# Patient Record
Sex: Male | Born: 1980 | Race: White | Hispanic: No | Marital: Married | Smoking: Former smoker
Health system: Southern US, Community
[De-identification: ages and names within clinical notes are randomized; demographics above are authoritative.]

---
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
## 2009-07-21 ENCOUNTER — Ambulatory Visit: Payer: Self-pay | Admitting: Internal Medicine

## 2009-07-21 DIAGNOSIS — M545 Low back pain: Secondary | ICD-10-CM

## 2009-07-21 DIAGNOSIS — R519 Headache, unspecified: Secondary | ICD-10-CM | POA: Insufficient documentation

## 2009-07-21 DIAGNOSIS — R51 Headache: Secondary | ICD-10-CM

## 2009-07-21 LAB — CONVERTED CEMR LAB
AST: 29 units/L (ref 0–37)
Alkaline Phosphatase: 56 units/L (ref 39–117)
BUN: 6 mg/dL (ref 6–23)
Basophils Absolute: 0 10*3/uL (ref 0.0–0.1)
Bilirubin, Direct: 0.1 mg/dL (ref 0.0–0.3)
Calcium: 9.7 mg/dL (ref 8.4–10.5)
GFR calc non Af Amer: 93.68 mL/min (ref >60)
Glucose, Bld: 85 mg/dL (ref 70–99)
Hemoglobin: 17.1 g/dL — ABNORMAL HIGH (ref 13.0–17.0)
Lymphocytes Relative: 27.9 % (ref 12.0–46.0)
Monocytes Relative: 6.6 % (ref 3.0–12.0)
Neutrophils Relative %: 63.9 % (ref 43.0–77.0)
Platelets: 317 10*3/uL (ref 150.0–400.0)
RDW: 12 % (ref 11.5–14.6)
Sodium: 139 meq/L (ref 135–145)
Total Bilirubin: 1.1 mg/dL (ref 0.3–1.2)

## 2009-11-10 ENCOUNTER — Telehealth: Payer: Self-pay | Admitting: Internal Medicine

## 2010-09-14 NOTE — Progress Notes (Signed)
Summary: requesting labs  Phone Note Call from Patient Call back at Home Phone [phone]   Caller: Patient---live call Summary of Call: Pt is requesting STD labs. which ones to order? Please list them. Thanks. Initial call taken by: Warnell Forester,  November 10, 2009 9:40 AM                                               without high risk exposure history and in the absense of signs or symptoms, HIV would be only recommendation

## 2018-03-11 ENCOUNTER — Encounter: Payer: Self-pay | Admitting: Family Medicine

## 2018-03-11 ENCOUNTER — Ambulatory Visit: Payer: 59 | Admitting: Family Medicine

## 2018-03-11 VITALS — BP 138/84 | HR 78 | Temp 98.9°F | Wt 200.2 lb

## 2018-03-11 DIAGNOSIS — M7041 Prepatellar bursitis, right knee: Secondary | ICD-10-CM

## 2018-03-11 DIAGNOSIS — R748 Abnormal levels of other serum enzymes: Secondary | ICD-10-CM | POA: Diagnosis not present

## 2018-03-11 DIAGNOSIS — Z13220 Encounter for screening for lipoid disorders: Secondary | ICD-10-CM | POA: Diagnosis not present

## 2018-03-11 DIAGNOSIS — Z114 Encounter for screening for human immunodeficiency virus [HIV]: Secondary | ICD-10-CM | POA: Diagnosis not present

## 2018-03-11 NOTE — Progress Notes (Signed)
Subjective:  Roy Cervantes is a 37 y.o. male who presents today with a chief complaint of knee swelling and to establish care.   HPI:  Knee Swelling, chronic problem Symptoms started couple weeks ago.  Located to his right knee.  No obvious precipitating events.  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  Symptoms have been stable over the last couple of weeks.  No specific treatments tried.  No history of trauma to the area.  No significant pain.  No limited range of motion.  Elevated Liver Enzymes, new problem to provider Patient found to have elevated liver test on recent screening blood work.  He is not sure of which tests were elevated.  He is concerned because he has a family history of liver disease.  ROS: Per HPI, otherwise a complete review of systems was negative.   PMH:  The following were reviewed and entered/updated in epic: History reviewed. No pertinent past medical history. Patient Active Problem List   Diagnosis Date Noted  . LOW BACK PAIN 07/21/2009  . HEADACHE 07/21/2009   History reviewed. No pertinent surgical history.  Family History  Problem Relation Age of Onset  . Liver disease Mother   . Mesothelioma Maternal Grandfather   . Heart disease Neg Hx     Medications- reviewed and updated No current outpatient medications on file.   No current facility-administered medications for this visit.     Allergies-reviewed and updated No Known Allergies  Social History   Socioeconomic History  . Marital status: Married    Spouse name: Not on file  . Number of children: Not on file  . Years of education: Not on file  . Highest education level: Not on file  Occupational History  . Not on file  Social Needs  . Financial resource strain: Not on file  . Food insecurity:    Worry: Not on file    Inability: Not on file  . Transportation needs:    Medical: Not on file    Non-medical: Not on file  Tobacco Use  . Smoking  status: Former Games developermoker  . Smokeless tobacco: Never Used  . Tobacco comment: 4 years about a pack per day  Substance and Sexual Activity  . Alcohol use: Yes  . Drug use: Never  . Sexual activity: Yes  Lifestyle  . Physical activity:    Days per week: Not on file    Minutes per session: Not on file  . Stress: Not on file  Relationships  . Social connections:    Talks on phone: Not on file    Gets together: Not on file    Attends religious service: Not on file    Active member of club or organization: Not on file    Attends meetings of clubs or organizations: Not on file    Relationship status: Not on file  Other Topics Concern  . Not on file  Social History Narrative  . Not on file    Objective:  Physical Exam: BP 138/84 (BP Location: Left Arm, Patient Position: Sitting, Cuff Size: Normal)   Pulse 78   Temp 98.9 F (37.2 C) (Oral)   Wt 200 lb 3.2 oz (90.8 kg)   SpO2 98%   BMI 27.15 kg/m   Gen: NAD, resting comfortably CV: RRR with no murmurs appreciated Pulm: NWOB, CTAB with no crackles, wheezes, or rhonchi GI: Normal bowel sounds present. Soft, Nontender, Nondistended. MSK: -Right knee: Approximately 5  cm cystic lesion on anterior aspect of right knee.  No other deformities noted.  Strength 5 out of 5 in all directions.  Anterior and posterior drawer signs negative. Skin: Warm, dry Neuro: Grossly normal, moves all extremities Psych: Normal affect and thought content  Assessment/Plan:  Prepatellar bursitis No red flag signs or symptoms.  No signs of infection.  Knee swelling likely secondary to prepatellar bursitis.  Recommended use of compression, ice, elevation, and over the counter anti-inflammatories as needed.  Discussed reasons to return to care.  Elevated liver enzymes Check CMET with next blood draw.  Preventative Healthcare Patient was instructed to return soon for CPE.  We will check blood work with lipid panel and HIV antibody at that time. Health  Maintenance Due  Topic Date Due  . HIV Screening  03/26/1995   Katina Degree. [name], MD 03/11/2018 4:15 PM

## 2018-03-11 NOTE — Patient Instructions (Addendum)
It was very nice to see you today!  You have prepatellar bursitis.  This is a benign swelling and 1 of the lubricating sacs in your knee.  This does not cause any issue or long-term harm.  Please use compression, over-the-counter anti-inflammatories, and ice as needed.  Please let me know if you have any worsening pain, redness, swelling, or any other change in symptoms.  Please come back soon for blood work and your physical.   Take care, Dr [name]   Prepatellar Bursitis Prepatellar bursitis is inflammation of the prepatellar bursa. The prepatellar bursa is a fluid-filled sac that cushions the kneecap (patella). Prepatellar bursitis happens when fluid builds up in the this sac and causes it to get larger. The condition causes knee pain. What are the causes? This condition may be caused by:  Constant pressure on the knees from kneeling.  A hit to the knee.  Falling on the knee.  A bacterial infection.  Moving the knee often in a forceful way.  What increases the risk? This condition is more likely to develop in:  People who play a sport that involves a risk for falls on the knee or hard hits (blows) to the knee. These sports include: ? Football. ? Wrestling. ? Basketball. ? Soccer.  People who have to kneel for long periods of time, such as roofers, plumbers, and gardeners.  People with another inflammatory condition, such as gout or rheumatoid arthritis.  What are the signs or symptoms? The most common symptom of this condition is knee pain that gets better with rest. Other symptoms include:  Swelling on the front of the kneecap.  Warmth in the knee.  Tenderness with activity.  Redness in the knee.  Inability to bend the knee or to kneel.  How is this diagnosed? This condition may be diagnosed based on:  Your symptoms.  Your medical history.  A physical exam. During the exam, your provider will compare your knees and check for tenderness and pain when moving  your knee. Your health care provider may also use a needle to remove fluid from the bursa to help diagnose an infection.  Tests, such as: ? A blood test that checks for infection. ? X-rays. These may be taken to check the structure of the patella. ? MRI or ultrasound. These may be done to check for swelling and fluid buildup in the bursa.  How is this treated? This condition may be treated by:  Resting the knee.  Applying ice to the knee.  Medicine, such as: ? Nonsteroidal anti-inflammatory drugs (NSAIDs). These can help to reduce pain and swelling. ? Antibiotic medicines. These may be needed if you have an infection. ? Steroid medicines. These may be prescribed if other treatments are not helping.  Raising (elevating) the knee while resting.  Doing strengthening and stretching exercises (physical therapy). These may be recommended after pain and swelling improve.  Having a procedure to remove fluid from the bursa. This may be done if other treatment is not helping.  Having surgery to remove the bursa. This may be done if you have a severe infection or if the condition keeps coming back after treatment.  Follow these instructions at home: Medicines  Take over-the-counter and prescription medicines only as told by your health care provider.  If you were prescribed an antibiotic medicine, take it as told by your health care provider. Do not stop taking the antibiotic even if you start to feel better. Managing pain, stiffness, and swelling  If  directed, apply ice to your knee. ? Put ice in a plastic bag. ? Place a towel between your skin and the bag. ? Leave the ice on for 20 minutes, 2-3 times a day.  Elevate your knee above the level of your heart while you are sitting or lying down. Driving  Do not drive or operate heavy machinery while taking prescription pain medicine.  Ask your health care provider when it is safe fpr you to drive. Activity  Rest your knee.  Avoid  activities that cause pain.  Return to your normal activities as told by your health care provider. Ask your health care provider what activities are safe for you.  Do exercises as told by your health care provider. General instructions  Do not use the injured limb to support your body weight until your health care provider says that you can.  Do not use any tobacco products, such as cigarettes, chewing tobacco, and e-cigarettes. Tobacco can delay bone healing. If you need help quitting, ask your health care provider.  Keep all follow-up visits as told by your health care provider. This is important. How is this prevented?  Warm up and stretch before being active.  Cool down and stretch after being active.  Give your body time to rest between periods of activity.  Make sure to use equipment that fits you.  Be safe and responsible while being active to avoid falls.  Do at least 150 minutes of moderate-intensity exercise each week, such as brisk walking or water aerobics.  Maintain physical fitness, including: ? Strength. ? Flexibility. ? Cardiovascular fitness. ? Endurance. Contact a health care provider if:  Your symptoms do not improve.  Your symptoms get worse.  Your symptoms keep coming back after treatment.  You develop a fever and have warmth, redness, and swelling over your knee. This information is not intended to replace advice given to you by your health care provider. Make sure you discuss any questions you have with your health care provider. Document Released: 07/30/2005 Document Revised: 04/03/2016 Document Reviewed: 04/29/2015 Elsevier Interactive Patient Education  [employer]2018 Else[employer] Inc[employer]

## 2018-08-21 ENCOUNTER — Ambulatory Visit (INDEPENDENT_AMBULATORY_CARE_PROVIDER_SITE_OTHER): Payer: 59

## 2018-08-21 ENCOUNTER — Encounter: Payer: Self-pay | Admitting: Family Medicine

## 2018-08-21 ENCOUNTER — Ambulatory Visit: Payer: 59 | Admitting: Family Medicine

## 2018-08-21 VITALS — BP 112/76 | HR 59 | Temp 97.9°F | Ht 72.0 in | Wt 206.2 lb

## 2018-08-21 DIAGNOSIS — M545 Low back pain, unspecified: Secondary | ICD-10-CM

## 2018-08-21 DIAGNOSIS — M47815 Spondylosis without myelopathy or radiculopathy, thoracolumbar region: Secondary | ICD-10-CM | POA: Diagnosis not present

## 2018-08-21 MED ORDER — CYCLOBENZAPRINE HCL 10 MG PO TABS: 10.0000 mg | ORAL_TABLET | Freq: Three times a day (TID) | ORAL | 0 refills | Status: DC | PRN

## 2018-08-21 MED ORDER — DICLOFENAC SODIUM 75 MG PO TBEC: 75.0000 mg | DELAYED_RELEASE_TABLET | Freq: Two times a day (BID) | ORAL | 0 refills | Status: DC

## 2018-08-21 NOTE — Progress Notes (Signed)
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 Recommend continuing meds and exercise as we discussed. Can refer to Dr Berline Chough if not improving.  Roy Cervantes. [name], MD 08/21/2018 1:01 PM

## 2018-08-21 NOTE — Patient Instructions (Signed)
It was very nice to see you today!  I think you have tight muscles and connective tissue in your lower back that is causing your symptoms.  Please start the anti-inflammatory. Use the  muscle relaxer as needed.  Please work on exercises.  We will see you back in a couple weeks for your physical.  We may need to get you in to see sports medicine if your symptoms are not improving.  Take care, Dr [name]

## 2018-08-21 NOTE — Progress Notes (Signed)
   Subjective:  Roy Cervantes is a 38 y.o. male who presents today for same-day appointment with a chief complaint of low back pain.   HPI:  Low Back Pain, Acute problem He has had intermittent back pain on and off for several years.  He has never been seen here for this problem.  Starting about 5 days ago he had significant worsening of his symptoms.  Symptoms occurred randomly while standing.  Since then he has had persistent, severe lower back pain.  The first day he had some radiation into his hips and knees bilaterally, however this is since subsided.  He took a few doses of gabapentin which seemed to have helped.  Has seen a chiropractor in the past which has helped. No other treatments tried.  No weakness or numbness.  No bowel or bladder incontinence.  No urinary retention.  No other obvious alleviating or aggravating factors.   ROS: Per HPI  PMH: He reports that he has quit smoking. He has never used smokeless tobacco. He reports current alcohol use. He reports that he does not use drugs.  Objective:  Physical Exam: BP 112/76 (BP Location: Left Arm, Patient Position: Sitting, Cuff Size: Large)   Pulse (!) 59   Temp 97.9 F (36.6 C) (Oral)   Ht 6' (1.829 m)   Wt 206 lb 4 oz (93.6 kg)   SpO2 96%   BMI 27.97 kg/m   Wt Readings from Last 3 Encounters:  08/21/18 206 lb 4 oz (93.6 kg)  03/11/18 200 lb 3.2 oz (90.8 kg)  Gen: NAD, resting comfortably MSK: -Back: No deformities.  No midline tenderness.  Some tenderness to palpation along lower lumbar paraspinal muscles with some connective tissue tightness noted. -Lower extremities: No deformities.  Strength 5 out of 5 throughout.  Neurovascular intact distally.  Straight leg raise test negative bilaterally.  Assessment/Plan:  Low Back Pain No red flags. Likely muscular.  Given that he has had intermittent symptoms for the past several years and no recent imaging, we will check plain film today.  Start diclofenac 75 mg twice daily  x1 to 2 weeks.  Start Flexeril 10 mg 3 times daily as needed.  Discussed home exercise program and handout was given.  Discussed reasons to return to care.  May need sports medicine referral if symptoms do not improve with above.  Katina Degreealeb M. Jimmey RalphParker, MD 08/21/2018 9:53 AM

## 2018-08-29 ENCOUNTER — Encounter: Payer: Self-pay | Admitting: Family Medicine

## 2018-08-29 ENCOUNTER — Ambulatory Visit (INDEPENDENT_AMBULATORY_CARE_PROVIDER_SITE_OTHER): Payer: 59 | Admitting: Family Medicine

## 2018-08-29 VITALS — BP 118/78 | HR 65 | Temp 98.7°F | Ht 72.0 in | Wt 200.2 lb

## 2018-08-29 DIAGNOSIS — R748 Abnormal levels of other serum enzymes: Secondary | ICD-10-CM | POA: Diagnosis not present

## 2018-08-29 DIAGNOSIS — Z114 Encounter for screening for human immunodeficiency virus [HIV]: Secondary | ICD-10-CM | POA: Diagnosis not present

## 2018-08-29 DIAGNOSIS — Z6827 Body mass index (BMI) 27.0-27.9, adult: Secondary | ICD-10-CM

## 2018-08-29 DIAGNOSIS — Z0001 Encounter for general adult medical examination with abnormal findings: Secondary | ICD-10-CM | POA: Diagnosis not present

## 2018-08-29 DIAGNOSIS — Z13220 Encounter for screening for lipoid disorders: Secondary | ICD-10-CM | POA: Diagnosis not present

## 2018-08-29 LAB — CBC
HCT: 45.2 % (ref 39.0–52.0)
Hemoglobin: 15.9 g/dL (ref 13.0–17.0)
MCHC: 35.2 g/dL (ref 30.0–36.0)
MCV: 88.8 fl (ref 78.0–100.0)
Platelets: 319 10*3/uL (ref 150.0–400.0)
RBC: 5.09 Mil/uL (ref 4.22–5.81)
RDW: 12.6 % (ref 11.5–15.5)
WBC: 5.7 10*3/uL (ref 4.0–10.5)

## 2018-08-29 LAB — LIPID PANEL
Cholesterol: 174 mg/dL (ref 0–200)
HDL: 39.3 mg/dL (ref >39.00)
LDL Cholesterol: 104 mg/dL — ABNORMAL HIGH (ref 0–99)
NonHDL: 134.23
Total CHOL/HDL Ratio: 4
Triglycerides: 149 mg/dL (ref 0.0–149.0)
VLDL: 29.8 mg/dL (ref 0.0–40.0)

## 2018-08-29 LAB — COMPREHENSIVE METABOLIC PANEL
ALT: 89 U/L — AB (ref 0–53)
AST: 38 U/L — AB (ref 0–37)
Albumin: 4.6 g/dL (ref 3.5–5.2)
Alkaline Phosphatase: 65 U/L (ref 39–117)
BILIRUBIN TOTAL: 0.9 mg/dL (ref 0.2–1.2)
BUN: 14 mg/dL (ref 6–23)
CHLORIDE: 105 meq/L (ref 96–112)
CO2: 25 meq/L (ref 19–32)
CREATININE: 1.02 mg/dL (ref 0.40–1.50)
Calcium: 9.9 mg/dL (ref 8.4–10.5)
GFR: 81.55 mL/min (ref >60.00)
Glucose, Bld: 91 mg/dL (ref 70–99)
Potassium: 4.3 mEq/L (ref 3.5–5.1)
Sodium: 138 mEq/L (ref 135–145)
Total Protein: 7.3 g/dL (ref 6.0–8.3)

## 2018-08-29 LAB — TSH: TSH: 3.4 u[IU]/mL (ref 0.35–4.50)

## 2018-08-29 NOTE — Progress Notes (Signed)
Subjective:  Roy Cervantes is a 38 y.o. male who presents today for his annual comprehensive physical exam.    HPI:  He has no acute complaints today.   Lifestyle Diet: Eats a lot of fast food. Trying to cut out junk foods.  Exercise: No regular exercise.   Depression screen PHQ 2/9 03/11/2018  Decreased Interest 0  Down, Depressed, Hopeless 0  PHQ - 2 Score 0   Health Maintenance Due  Topic Date Due  . HIV Screening  03/26/1995    ROS: Some back pain that is improving, otherwise a complete review of systems was negative.   PMH:  The following were reviewed and entered/updated in epic: History reviewed. No pertinent past medical history. There are no active problems to display for this patient.  History reviewed. No pertinent surgical history.  Family History  Problem Relation Age of Onset  . Liver disease Mother   . Mesothelioma Maternal Grandfather   . Heart disease Neg Hx     Medications- reviewed and updated No current outpatient medications on file.   No current facility-administered medications for this visit.     Allergies-reviewed and updated No Known Allergies  Social History   Socioeconomic History  . Marital status: Married    Spouse name: Not on file  . Number of children: Not on file  . Years of education: Not on file  . Highest education level: Not on file  Occupational History  . Not on file  Social Needs  . Financial resource strain: Not on file  . Food insecurity:    Worry: Not on file    Inability: Not on file  . Transportation needs:    Medical: Not on file    Non-medical: Not on file  Tobacco Use  . Smoking status: Former Games developermoker  . Smokeless tobacco: Never Used  . Tobacco comment: 4 years about a pack per day  Substance and Sexual Activity  . Alcohol use: Yes  . Drug use: Never  . Sexual activity: Yes  Lifestyle  . Physical activity:    Days per week: Not on file    Minutes per session: Not on file  . Stress: Not on  file  Relationships  . Social connections:    Talks on phone: Not on file    Gets together: Not on file    Attends religious service: Not on file    Active member of club or organization: Not on file    Attends meetings of clubs or organizations: Not on file    Relationship status: Not on file  Other Topics Concern  . Not on file  Social History Narrative  . Not on file    Objective:  Physical Exam: BP 118/78 (BP Location: Left Arm, Patient Position: Sitting, Cuff Size: Normal)   Pulse 65   Temp 98.7 F (37.1 C) (Oral)   Ht 6' (1.829 m)   Wt 200 lb 4 oz (90.8 kg)   SpO2 99%   BMI 27.16 kg/m   Body mass index is 27.16 kg/m. Wt Readings from Last 3 Encounters:  08/29/18 200 lb 4 oz (90.8 kg)  08/21/18 206 lb 4 oz (93.6 kg)  03/11/18 200 lb 3.2 oz (90.8 kg)   Gen: NAD, resting comfortably HEENT: TMs normal bilaterally. OP clear. No thyromegaly noted.  CV: RRR with no murmurs appreciated Pulm: NWOB, CTAB with no crackles, wheezes, or rhonchi GI: Normal bowel sounds present. Soft, Nontender, Nondistended. MSK: no edema, cyanosis, or clubbing noted Skin:  warm, dry Neuro: CN2-12 grossly intact. Strength 5/5 in upper and lower extremities. Reflexes symmetric and intact bilaterally.  Psych: Normal affect and thought content  Assessment/Plan:  Elevated LFT Check CBC, CMET, TSH  BMI 27 Discussed lifestyle modifications.  Preventative Healthcare: Check lipid panel and HIV antibody.  Patient Counseling(The following topics were reviewed and/or handout was given):  -Nutrition: Stressed importance of moderation in sodium/caffeine intake, saturated fat and cholesterol, caloric balance, sufficient intake of fresh fruits, vegetables, and fiber.  -Stressed the importance of regular exercise.   -Substance Abuse: Discussed cessation/primary prevention of tobacco, alcohol, or other drug use; driving or other dangerous activities under the influence; availability of treatment for  abuse.   -Injury prevention: Discussed safety belts, safety helmets, smoke detector, smoking near bedding or upholstery.   -Sexuality: Discussed sexually transmitted diseases, partner selection, use of condoms, avoidance of unintended pregnancy and contraceptive alternatives.   -Dental health: Discussed importance of regular tooth brushing, flossing, and dental visits.  -Health maintenance and immunizations reviewed. Please refer to Health maintenance section.  Return to care in 1 year for next preventative visit.   Katina Degreealeb M. Jimmey RalphParker, MD 08/29/2018 8:40 AM

## 2018-08-29 NOTE — Patient Instructions (Signed)
It was very nice to see you today!  Keep up the good work!  We will check blood work today.  Come back to see me in 1 year for your next physical, or sooner as needed.   Take care, Dr Jerline Pain   Preventive Care 18-39 Years, Male Preventive care refers to lifestyle choices and visits with your health care provider that can promote health and wellness. What does preventive care include?   A yearly physical exam. This is also called an annual well check.  Dental exams once or twice a year.  Routine eye exams. Ask your health care provider how often you should have your eyes checked.  Personal lifestyle choices, including: ? Daily care of your teeth and gums. ? Regular physical activity. ? Eating a healthy diet. ? Avoiding tobacco and drug use. ? Limiting alcohol use. ? Practicing safe sex. What happens during an annual well check? The services and screenings done by your health care provider during your annual well check will depend on your age, overall health, lifestyle risk factors, and family history of disease. Counseling Your health care provider may ask you questions about your:  Alcohol use.  Tobacco use.  Drug use.  Emotional well-being.  Home and relationship well-being.  Sexual activity.  Eating habits.  Work and work Statistician. Screening You may have the following tests or measurements:  Height, weight, and BMI.  Blood pressure.  Lipid and cholesterol levels. These may be checked every 5 years starting at age 31.  Diabetes screening. This is done by checking your blood sugar (glucose) after you have not eaten for a while (fasting).  Skin check.  Hepatitis C blood test.  Hepatitis B blood test.  Sexually transmitted disease (STD) testing. Discuss your test results, treatment options, and if necessary, the need for more tests with your health care provider. Vaccines Your health care provider may recommend certain vaccines, such  as:  Influenza vaccine. This is recommended every year.  Tetanus, diphtheria, and acellular pertussis (Tdap, Td) vaccine. You may need a Td booster every 10 years.  Varicella vaccine. You may need this if you have not been vaccinated.  HPV vaccine. If you are 76 or younger, you may need three doses over 6 months.  Measles, mumps, and rubella (MMR) vaccine. You may need at least one dose of MMR.You may also need a second dose.  Pneumococcal 13-valent conjugate (PCV13) vaccine. You may need this if you have certain conditions and have not been vaccinated.  Pneumococcal polysaccharide (PPSV23) vaccine. You may need one or two doses if you smoke cigarettes or if you have certain conditions.  Meningococcal vaccine. One dose is recommended if you are age 49-21 years and a first-year college student living in a residence hall, or if you have one of several medical conditions. You may also need additional booster doses.  Hepatitis A vaccine. You may need this if you have certain conditions or if you travel or work in places where you may be exposed to hepatitis A.  Hepatitis B vaccine. You may need this if you have certain conditions or if you travel or work in places where you may be exposed to hepatitis B.  Haemophilus influenzae type b (Hib) vaccine. You may need this if you have certain risk factors. Talk to your health care provider about which screenings and vaccines you need and how often you need them. This information is not intended to replace advice given to you by your health care provider. Make  sure you discuss any questions you have with your health care provider. Document Released: 09/25/2001 Document Revised: 03/12/2017 Document Reviewed: 05/31/2015 Elsevier Interactive Patient Education  2019 [employer].

## 2018-08-30 LAB — HIV ANTIBODY (ROUTINE TESTING W REFLEX): HIV: NONREACTIVE

## 2018-09-01 ENCOUNTER — Encounter: Payer: Self-pay | Admitting: Family Medicine

## 2018-09-01 DIAGNOSIS — R945 Abnormal results of liver function studies: Secondary | ICD-10-CM

## 2018-09-01 DIAGNOSIS — R7989 Other specified abnormal findings of blood chemistry: Secondary | ICD-10-CM | POA: Insufficient documentation

## 2018-09-01 DIAGNOSIS — E785 Hyperlipidemia, unspecified: Secondary | ICD-10-CM | POA: Insufficient documentation

## 2018-09-01 NOTE — Progress Notes (Signed)
Please inform patient of the following:  His liver tests were up just a bit. Would like for him to come back in a week or two to recheck. Please place future order for CMET.  Otherwise, his cholesterol was up just a bit but everything else looks good. Do not need to start meds. Recommend continuing to work on diet and exercise and we can recheck everything else in a year or so.  Katina Degree. [name], MD 09/01/2018 5:36 PM

## 2018-09-02 ENCOUNTER — Other Ambulatory Visit: Payer: Self-pay | Admitting: Family Medicine

## 2018-09-02 DIAGNOSIS — R748 Abnormal levels of other serum enzymes: Secondary | ICD-10-CM

## 2018-09-12 ENCOUNTER — Other Ambulatory Visit (INDEPENDENT_AMBULATORY_CARE_PROVIDER_SITE_OTHER): Payer: 59

## 2018-09-12 DIAGNOSIS — R748 Abnormal levels of other serum enzymes: Secondary | ICD-10-CM

## 2018-09-12 LAB — COMPREHENSIVE METABOLIC PANEL
ALT: 92 U/L — AB (ref 0–53)
AST: 46 U/L — ABNORMAL HIGH (ref 0–37)
Albumin: 4.6 g/dL (ref 3.5–5.2)
Alkaline Phosphatase: 66 U/L (ref 39–117)
BILIRUBIN TOTAL: 0.7 mg/dL (ref 0.2–1.2)
BUN: 13 mg/dL (ref 6–23)
CO2: 26 mEq/L (ref 19–32)
Calcium: 9.8 mg/dL (ref 8.4–10.5)
Chloride: 105 mEq/L (ref 96–112)
Creatinine, Ser: 0.99 mg/dL (ref 0.40–1.50)
GFR: 84.39 mL/min (ref >60.00)
Glucose, Bld: 88 mg/dL (ref 70–99)
Potassium: 4.2 mEq/L (ref 3.5–5.1)
Sodium: 137 mEq/L (ref 135–145)
Total Protein: 7.3 g/dL (ref 6.0–8.3)

## 2018-09-13 NOTE — Progress Notes (Signed)
Please inform patient of the following:  Liver tests are still elevated. Recommend checking RUQ ultrasound to take a look at his liver. Please place future order.   Katina Degree. [name], MD 09/13/2018 11:24 AM

## 2018-09-15 ENCOUNTER — Other Ambulatory Visit: Payer: Self-pay | Admitting: Family Medicine

## 2018-09-15 ENCOUNTER — Telehealth: Payer: Self-pay | Admitting: Family Medicine

## 2018-09-15 DIAGNOSIS — R748 Abnormal levels of other serum enzymes: Secondary | ICD-10-CM

## 2018-09-15 NOTE — Telephone Encounter (Signed)
Related  Lab results to Patient states he already received results.  Ultrsound appointment already  Scheduled.

## 2018-09-15 NOTE — Telephone Encounter (Signed)
Copied from CRM [id number]. Topic: Quick Communication - Lab Results (Clinic Use ONLY) >> Sep 15, 2018  8:27 AM Erick Alley, CMA wrote: Called patient to inform them of  lab results. When patient returns call, triage nurse may disclose results. >> Sep 15, 2018 10:49 AM Arlyss Gandy, NT wrote: Pt returning call to go over lab results.

## 2018-09-15 NOTE — Telephone Encounter (Signed)
See 2nd telephone encounter from 09/15/18

## 2018-09-16 ENCOUNTER — Other Ambulatory Visit: Payer: 59

## 2018-09-19 ENCOUNTER — Ambulatory Visit
Admission: RE | Admit: 2018-09-19 | Discharge: 2018-09-19 | Disposition: A | Discharge status: Home / Self Care | Payer: 59 | Source: Ambulatory Visit | Attending: Family Medicine | Admitting: Family Medicine

## 2018-09-19 DIAGNOSIS — R748 Abnormal levels of other serum enzymes: Secondary | ICD-10-CM

## 2018-09-19 DIAGNOSIS — R7989 Other specified abnormal findings of blood chemistry: Secondary | ICD-10-CM | POA: Diagnosis not present

## 2018-09-24 NOTE — Progress Notes (Signed)
Please inform patient of the following:  His liver ultrasound was NORMAL. Do not need to do any further testing at this point. Would like to recheck labs again in 3-6 months to make sure they are stable. Please place future order for LFTs.   Katina Degree. [name], MD 09/24/2018 10:54 AM

## 2018-09-25 ENCOUNTER — Other Ambulatory Visit: Payer: Self-pay | Admitting: Family Medicine

## 2018-09-25 DIAGNOSIS — R748 Abnormal levels of other serum enzymes: Secondary | ICD-10-CM

## 2019-06-26 ENCOUNTER — Other Ambulatory Visit: Payer: Self-pay

## 2019-06-26 ENCOUNTER — Telehealth (INDEPENDENT_AMBULATORY_CARE_PROVIDER_SITE_OTHER): Payer: 59 | Admitting: Family Medicine

## 2019-06-26 DIAGNOSIS — J3089 Other allergic rhinitis: Secondary | ICD-10-CM

## 2019-06-26 MED ORDER — FLUTICASONE PROPIONATE 50 MCG/ACT NA SUSP: 1.0000 | Freq: Every day | NASAL | 1 refills | Status: DC

## 2019-06-26 NOTE — Progress Notes (Signed)
Virtual Visit via Video Note  I connected with Roy Cervantes on 06/26/19 at  3:00 PM EST by a video enabled telemedicine application 2/2 [id number] pandemic and verified that I am speaking with the correct person using two identifiers.  Location patient: home Location provider:work or home office Persons participating in the virtual visit: patient, provider  I discussed the limitations of evaluation and management by telemedicine and the availability of in person appointments. The patient expressed understanding and agreed to proceed.   HPI: Pt is a 38 yo male with pmh sig for HLD followed by [name], MD    Pt started feeling bad on Monday.  Recently cleaned out a shed that was dusty.  Having pressure in ears, nasal drainage, "congestion in head", and rhinorrhea. Had a mild HA yesterday with pain in L occipital area, since resolved.  Denies fever, sore throat, facial pain/pressure, loss of taste or smell.  Took OTC sinus/cold med.     ROS: See pertinent positives and negatives per HPI.  No past medical history on file.  No past surgical history on file.  Family History  Problem Relation Age of Onset  . Liver disease Mother   . Mesothelioma Maternal Grandfather   . 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 No current outpatient medications on file.  EXAM:  VITALS per patient if applicable:  GENERAL: alert, oriented, appears well and in no acute distress  HEENT: atraumatic, conjunctiva clear, no obvious abnormalities on inspection of external nose and ears  NECK: normal movements of the head and neck  LUNGS: on inspection no signs of respiratory distress, breathing rate appears normal, no obvious gross SOB, gasping or wheezing  CV: no obvious cyanosis  MS: moves all visible extremities without noticeable abnormality  PSYCH/NEURO: pleasant and cooperative, no obvious depression or anxiety, speech and thought processing grossly  intact  ASSESSMENT AND PLAN:  Discussed the following assessment and plan:  Allergic rhinitis due to other allergic trigger, unspecified seasonality  -discussed symptoms may progress to sinusitis.  -will try supportive care over the wknd.  If symptoms continue on Monday will send in rx for abx.  Also advised to consider COVID testing at that point. - Plan: fluticasone (FLONASE) 50 MCG/ACT nasal spray  F/u prn   I discussed the assessment and treatment plan with the patient. The patient was provided an opportunity to ask questions and all were answered. The patient agreed with the plan and demonstrated an understanding of the instructions.   The patient was advised to call back or seek an in-person evaluation if the symptoms worsen or if the condition fails to improve as anticipated.   [name], MD

## 2019-08-27 IMAGING — US US ABDOMEN LIMITED
1 series · 14 of 25 positions shown · non-contrast
Comparison: None.

CLINICAL DATA: Elevated liver enzymes

EXAM:
ULTRASOUND ABDOMEN LIMITED RIGHT UPPER QUADRANT

[Series 1: us abdomen limited · 0.28mm/px · 14 of 44 slices shown]
[im 1/44]
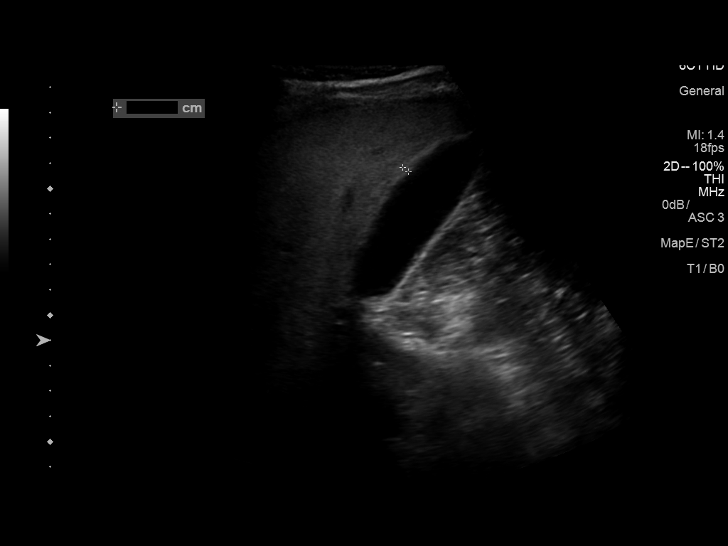
[im 4/44]
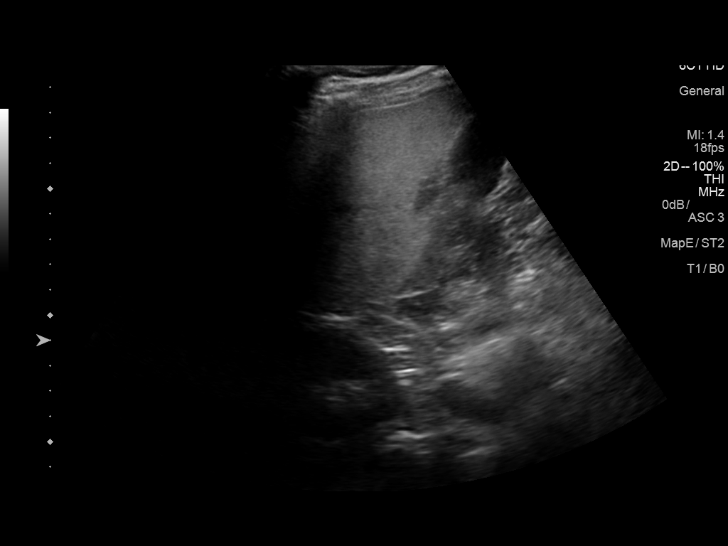
[im 8/44]
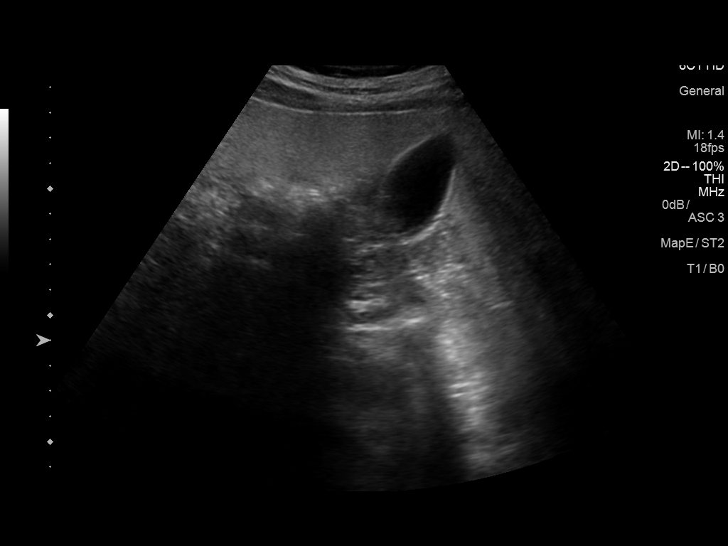
[im 11/44]
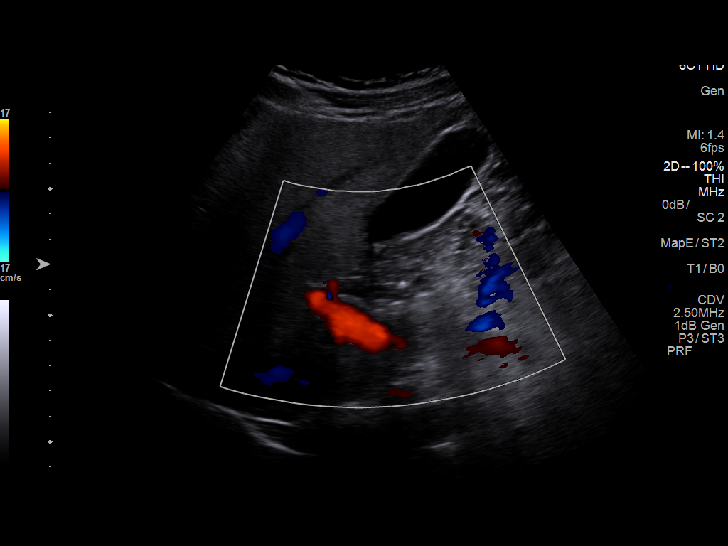
[im 15/44]
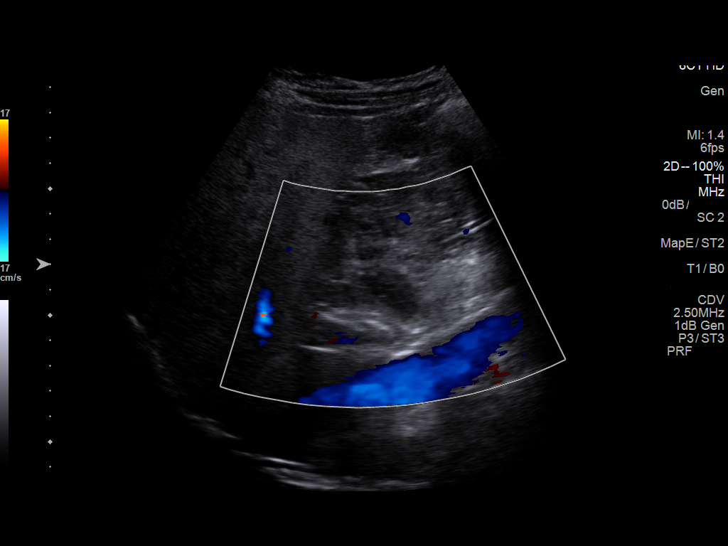
[im 17/44]
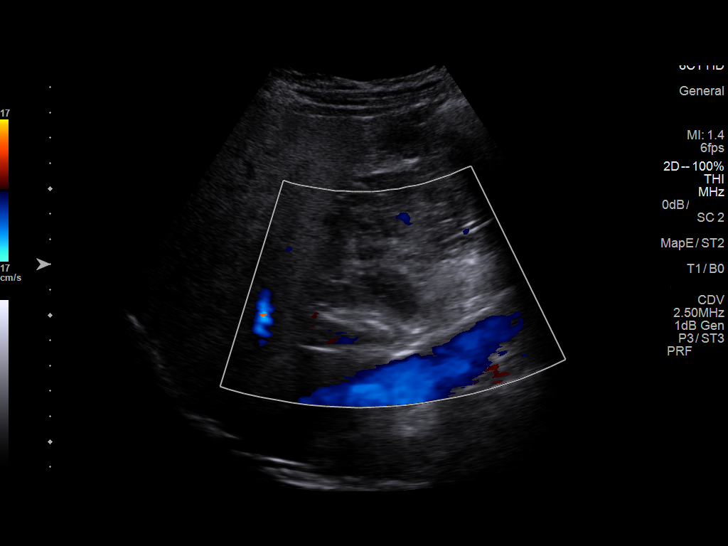
[im 20/44]
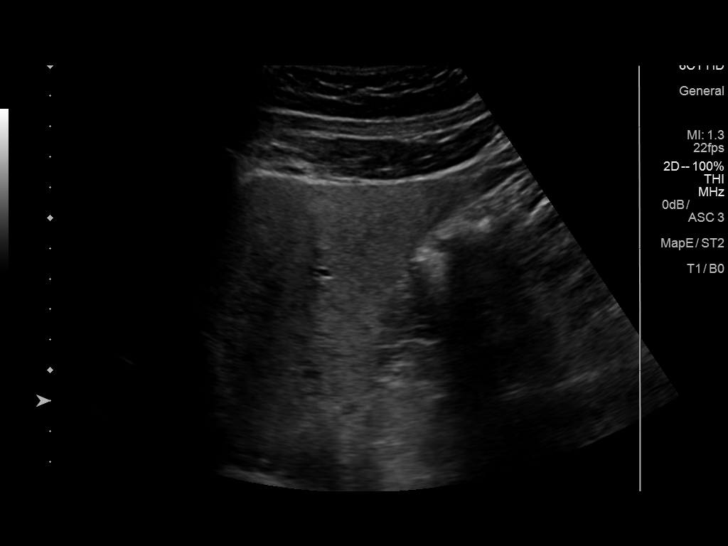
[im 24/44]
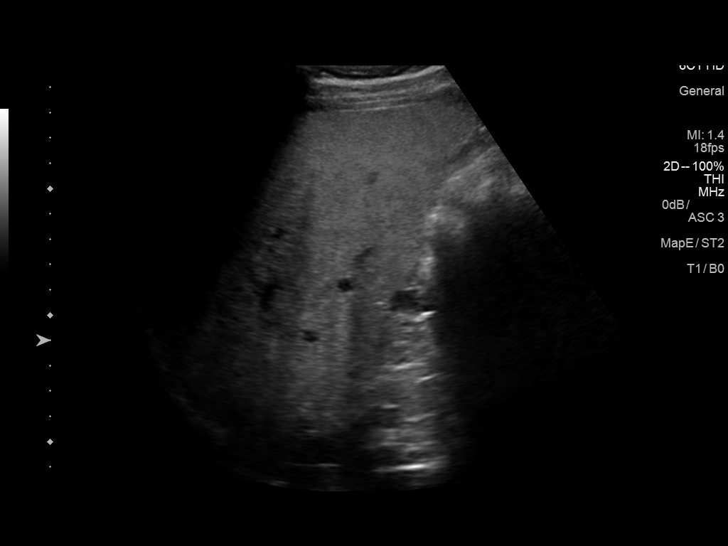
[im 27/44]
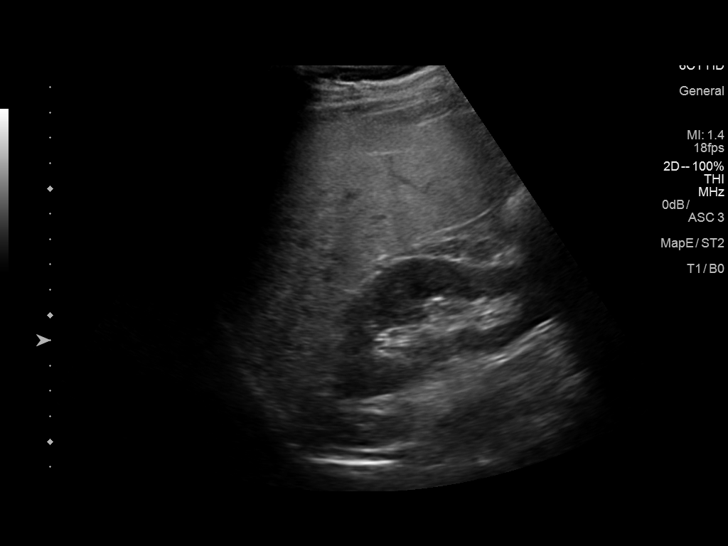
[im 29/44]
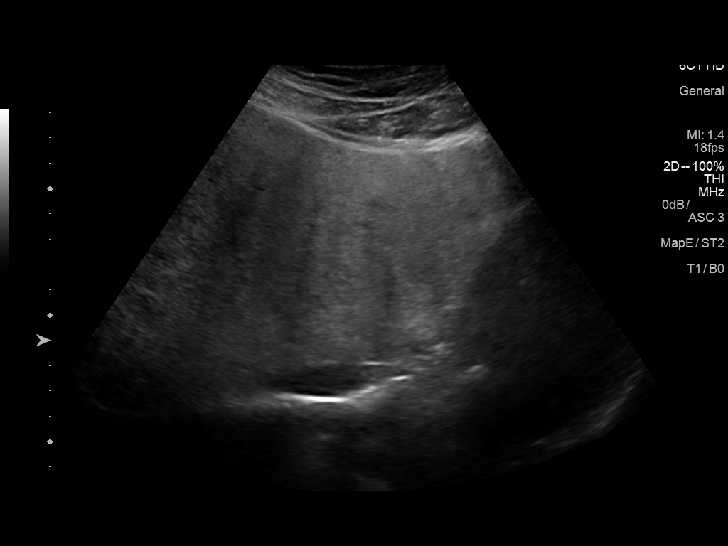
[im 33/44]
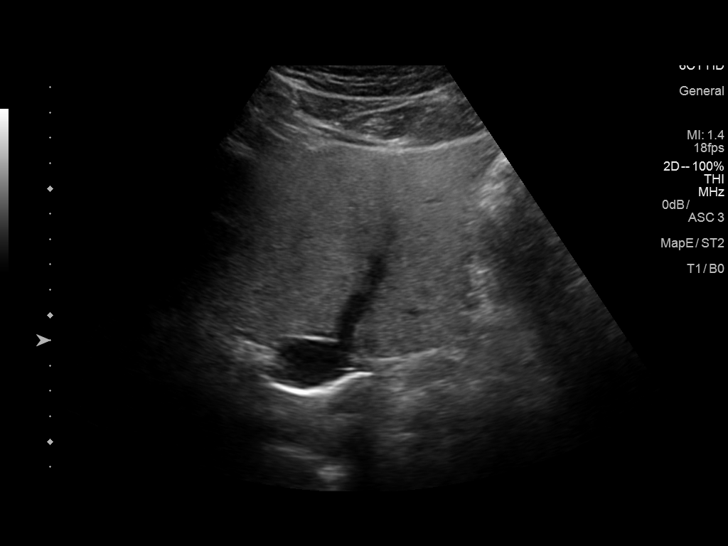
[im 36/44]
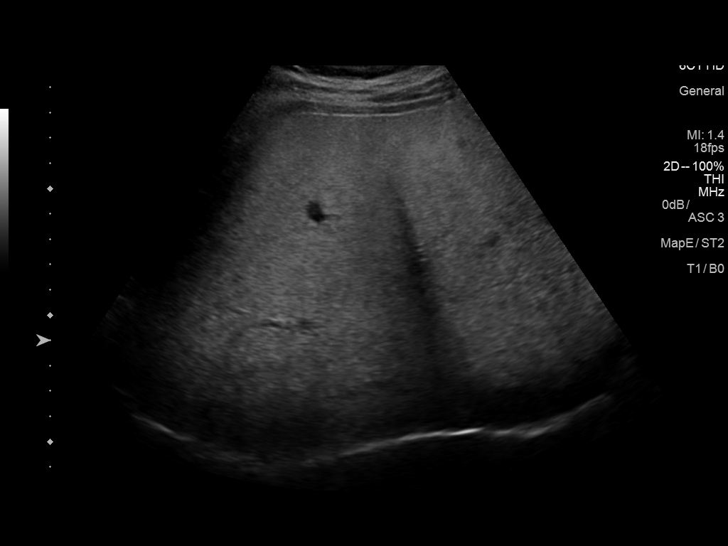
[im 40/44]
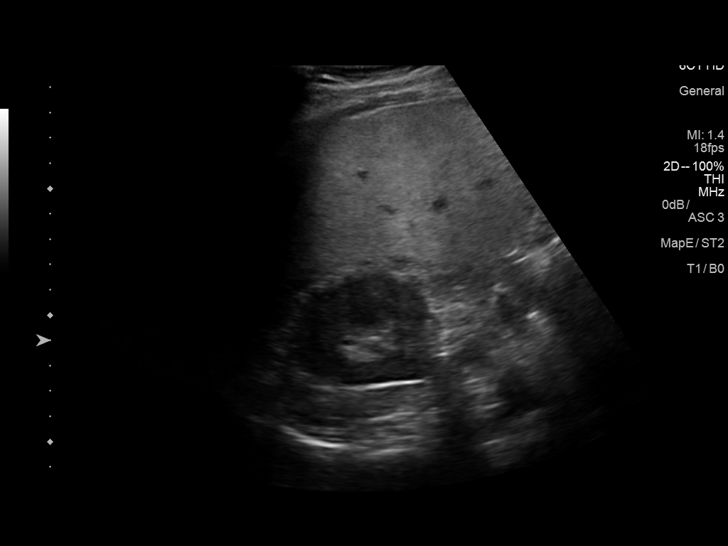
[im 44/44]
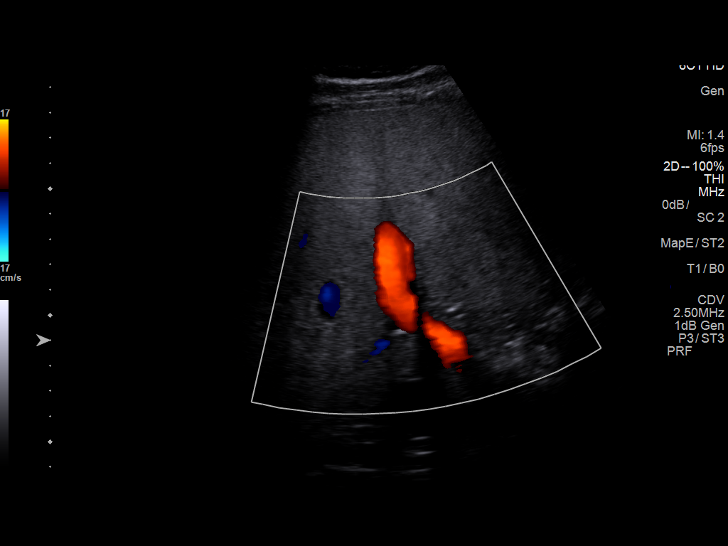

[14 of 25 positions shown; findings below may reference images not displayed]

FINDINGS: Gallbladder:

No gallstones or wall thickening visualized. No sonographic Murphy
sign noted by sonographer.

Common bile duct:

Diameter: 4.2 mm, unremarkable

Liver:

No focal lesion identified. Within normal limits in parenchymal
echogenicity. Portal vein is patent on color Doppler imaging with
normal direction of blood flow towards the liver.
IMPRESSION: Negative.  Normal gallbladder

## 2019-09-03 ENCOUNTER — Other Ambulatory Visit: Payer: Self-pay

## 2019-09-04 ENCOUNTER — Encounter: Payer: Self-pay | Admitting: Family Medicine

## 2019-09-04 ENCOUNTER — Ambulatory Visit (INDEPENDENT_AMBULATORY_CARE_PROVIDER_SITE_OTHER): Payer: 59 | Admitting: Family Medicine

## 2019-09-04 VITALS — BP 124/80 | HR 63 | Temp 97.2°F | Ht 72.0 in | Wt 195.2 lb

## 2019-09-04 DIAGNOSIS — R7989 Other specified abnormal findings of blood chemistry: Secondary | ICD-10-CM

## 2019-09-04 DIAGNOSIS — E785 Hyperlipidemia, unspecified: Secondary | ICD-10-CM | POA: Diagnosis not present

## 2019-09-04 DIAGNOSIS — J309 Allergic rhinitis, unspecified: Secondary | ICD-10-CM

## 2019-09-04 DIAGNOSIS — Z0001 Encounter for general adult medical examination with abnormal findings: Secondary | ICD-10-CM | POA: Diagnosis not present

## 2019-09-04 DIAGNOSIS — E663 Overweight: Secondary | ICD-10-CM

## 2019-09-04 DIAGNOSIS — Z6826 Body mass index (BMI) 26.0-26.9, adult: Secondary | ICD-10-CM

## 2019-09-04 LAB — LIPID PANEL
Cholesterol: 181 mg/dL (ref 0–200)
HDL: 38.2 mg/dL — ABNORMAL LOW (ref >39.00)
LDL Cholesterol: 112 mg/dL — ABNORMAL HIGH (ref 0–99)
NonHDL: 142.91
Total CHOL/HDL Ratio: 5
Triglycerides: 156 mg/dL — ABNORMAL HIGH (ref 0.0–149.0)
VLDL: 31.2 mg/dL (ref 0.0–40.0)

## 2019-09-04 LAB — SARS-COV-2 IGG: SARS-COV-2 IgG: 3.07

## 2019-09-04 LAB — TSH: TSH: 2.12 u[IU]/mL (ref 0.35–4.50)

## 2019-09-04 LAB — COMPREHENSIVE METABOLIC PANEL
ALT: 61 U/L — ABNORMAL HIGH (ref 0–53)
AST: 30 U/L (ref 0–37)
Albumin: 4.6 g/dL (ref 3.5–5.2)
Alkaline Phosphatase: 68 U/L (ref 39–117)
BUN: 9 mg/dL (ref 6–23)
CO2: 26 mEq/L (ref 19–32)
Calcium: 9.8 mg/dL (ref 8.4–10.5)
Chloride: 104 mEq/L (ref 96–112)
Creatinine, Ser: 0.95 mg/dL (ref 0.40–1.50)
GFR: 88.06 mL/min (ref >60.00)
Glucose, Bld: 93 mg/dL (ref 70–99)
Potassium: 4.5 mEq/L (ref 3.5–5.1)
Sodium: 139 mEq/L (ref 135–145)
Total Bilirubin: 0.9 mg/dL (ref 0.2–1.2)
Total Protein: 7.3 g/dL (ref 6.0–8.3)

## 2019-09-04 LAB — IBC + FERRITIN
Ferritin: 131.2 ng/mL (ref 22.0–322.0)
Iron: 142 ug/dL (ref 42–165)
Saturation Ratios: 46.1 % (ref 20.0–50.0)
Transferrin: 220 mg/dL (ref 212.0–360.0)

## 2019-09-04 LAB — CBC
HCT: 46.6 % (ref 39.0–52.0)
Hemoglobin: 16.2 g/dL (ref 13.0–17.0)
MCHC: 34.6 g/dL (ref 30.0–36.0)
MCV: 89.4 fl (ref 78.0–100.0)
Platelets: 351 10*3/uL (ref 150.0–400.0)
RBC: 5.22 Mil/uL (ref 4.22–5.81)
RDW: 12.8 % (ref 11.5–15.5)
WBC: 6.6 10*3/uL (ref 4.0–10.5)

## 2019-09-04 NOTE — Assessment & Plan Note (Signed)
Check lipid panel.  Continue lifestyle modifications. 

## 2019-09-04 NOTE — Patient Instructions (Signed)
It was very nice to see you today!  We will check blood work today.  Please try using Claritin or Zyrtec to help with your sinus congestion.  Please let me know if you would like referral to see your nose and throat specialist.  Come back in 1 year for your next physical, or sooner if needed.  Take care, Dr Jerline Pain  Please try these tips to maintain a healthy lifestyle:   Eat at least 3 REAL meals and 1-2 snacks per day.  Aim for no more than 5 hours between eating.  If you eat breakfast, please do so within one hour of getting up.    Each meal should contain half fruits/vegetables, one quarter protein, and one quarter carbs (no bigger than a computer mouse)   Cut down on sweet beverages. This includes juice, soda, and sweet tea.     Drink at least 1 glass of water with each meal and aim for at least 8 glasses per day   Exercise at least 150 minutes every week.    Preventive Care 40-87 Years Old, Male Preventive care refers to lifestyle choices and visits with your health care provider that can promote health and wellness. This includes:  A yearly physical exam. This is also called an annual well check.  Regular dental and eye exams.  Immunizations.  Screening for certain conditions.  Healthy lifestyle choices, such as eating a healthy diet, getting regular exercise, not using drugs or products that contain nicotine and tobacco, and limiting alcohol use. What can I expect for my preventive care visit? Physical exam Your health care provider will check:  Height and weight. These may be used to calculate body mass index (BMI), which is a measurement that tells if you are at a healthy weight.  Heart rate and blood pressure.  Your skin for abnormal spots. Counseling Your health care provider may ask you questions about:  Alcohol, tobacco, and drug use.  Emotional well-being.  Home and relationship well-being.  Sexual activity.  Eating habits.  Work and  work Statistician. What immunizations do I need?  Influenza (flu) vaccine  This is recommended every year. Tetanus, diphtheria, and pertussis (Tdap) vaccine  You may need a Td booster every 10 years. Varicella (chickenpox) vaccine  You may need this vaccine if you have not already been vaccinated. Human papillomavirus (HPV) vaccine  If recommended by your health care provider, you may need three doses over 6 months. Measles, mumps, and rubella (MMR) vaccine  You may need at least one dose of MMR. You may also need a second dose. Meningococcal conjugate (MenACWY) vaccine  One dose is recommended if you are 107-15 years old and a Market researcher living in a residence hall, or if you have one of several medical conditions. You may also need additional booster doses. Pneumococcal conjugate (PCV13) vaccine  You may need this if you have certain conditions and were not previously vaccinated. Pneumococcal polysaccharide (PPSV23) vaccine  You may need one or two doses if you smoke cigarettes or if you have certain conditions. Hepatitis A vaccine  You may need this if you have certain conditions or if you travel or work in places where you may be exposed to hepatitis A. Hepatitis B vaccine  You may need this if you have certain conditions or if you travel or work in places where you may be exposed to hepatitis B. Haemophilus influenzae type b (Hib) vaccine  You may need this if you have certain risk  factors. You may receive vaccines as individual doses or as more than one vaccine together in one shot (combination vaccines). Talk with your health care provider about the risks and benefits of combination vaccines. What tests do I need? Blood tests  Lipid and cholesterol levels. These may be checked every 5 years starting at age 62.  Hepatitis C test.  Hepatitis B test. Screening   Diabetes screening. This is done by checking your blood sugar (glucose) after you have not  eaten for a while (fasting).  Sexually transmitted disease (STD) testing. Talk with your health care provider about your test results, treatment options, and if necessary, the need for more tests. Follow these instructions at home: Eating and drinking   Eat a diet that includes fresh fruits and vegetables, whole grains, lean protein, and low-fat dairy products.  Take vitamin and mineral supplements as recommended by your health care provider.  Do not drink alcohol if your health care provider tells you not to drink.  If you drink alcohol: ? Limit how much you have to 0-2 drinks a day. ? Be aware of how much alcohol is in your drink. In the [place] one drink equals one 12 oz bottle of beer (355 mL), one 5 oz glass of wine (148 mL), or one 1 oz glass of hard liquor (44 mL). Lifestyle  Take daily care of your teeth and gums.  Stay active. Exercise for at least 30 minutes on 5 or more days each week.  Do not use any products that contain nicotine or tobacco, such as cigarettes, e-cigarettes, and chewing tobacco. If you need help quitting, ask your health care provider.  If you are sexually active, practice safe sex. Use a condom or other form of protection to prevent STIs (sexually transmitted infections). What's next?  Go to your health care provider once a year for a well check visit.  Ask your health care provider how often you should have your eyes and teeth checked.  Stay up to date on all vaccines. This information is not intended to replace advice given to you by your health care provider. Make sure you discuss any questions you have with your health care provider. Document Revised: 07/24/2018 Document Reviewed: 07/24/2018 Elsevier Patient Education  2020 [employer].

## 2019-09-04 NOTE — Assessment & Plan Note (Addendum)
Continue Flonase.  Recommended over-the-counter Claritin or Zyrtec as well. May have some component of deviated septum as well.  Discussed referral to ENT however patient declined for the time being.

## 2019-09-04 NOTE — Assessment & Plan Note (Signed)
Likely fatty liver.  We will recheck LFTs today.  Also check CBC, C met, TSH, iron panel, hep B antibody, hep C antibody.

## 2019-09-04 NOTE — Progress Notes (Signed)
Chief Complaint:  Roy Cervantes is a 39 y.o. male who presents today for his annual comprehensive physical exam.    Assessment/Plan:  Chronic Problems Addressed Today: Allergic rhinitis Continue Flonase.  Recommended over-the-counter Claritin or Zyrtec as well. May have some component of deviated septum as well.  Discussed referral to ENT however patient declined for the time being.  Dyslipidemia Check lipid panel.  Continue lifestyle modifications.  Elevated LFTs Likely fatty liver.  We will recheck LFTs today.  Also check CBC, C met, TSH, iron panel, hep B antibody, hep C antibody.   Body mass index is 26.48 kg/m. / Overweight BMI Metric Follow Up - 09/04/19 0900      BMI Metric Follow Up-Please document annually   BMI Metric Follow Up  Education provided       Preventative Healthcare: Check CBC, C met, TSH, lipid panel, hep B, hep C, and Covid antibody.  Patient Counseling(The following topics were reviewed and/or handout was given):  -Nutrition: Stressed importance of moderation in sodium/caffeine intake, saturated fat and cholesterol, caloric balance, sufficient intake of fresh fruits, vegetables, and fiber.  -Stressed the importance of regular exercise.   -Substance Abuse: Discussed cessation/primary prevention of tobacco, alcohol, or other drug use; driving or other dangerous activities under the influence; availability of treatment for abuse.   -Injury prevention: Discussed safety belts, safety helmets, smoke detector, smoking near bedding or upholstery.   -Sexuality: Discussed sexually transmitted diseases, partner selection, use of condoms, avoidance of unintended pregnancy and contraceptive alternatives.   -Dental health: Discussed importance of regular tooth brushing, flossing, and dental visits.  -Health maintenance and immunizations reviewed. Please refer to Health maintenance section.  Return to care in 1 year for next preventative visit.     Subjective:    HPI:  He has no acute complaints today.   He has been having ongoing issues with sinus congestion and ear pressure.  Has been using Flonase with modest improvement.  He has been working on weight loss as well.  Lifestyle Diet: Trying to eat a healthier diet.  Exercise: Busy with work.   Depression screen [facility] 2/9 09/04/2019  Decreased Interest 0  Down, Depressed, Hopeless 0  PHQ - 2 Score 0   ROS: Per HPI, otherwise a complete review of systems was negative.   PMH:  The following were reviewed and entered/updated in epic: No past medical history on file. Patient Active Problem List   Diagnosis Date Noted  . Allergic rhinitis 09/04/2019  . Elevated LFTs 09/01/2018  . Dyslipidemia 09/01/2018   No past surgical history on file.  Family History  Problem Relation Age of Onset  . Liver disease Mother   . Mesothelioma Maternal Grandfather   . Heart disease Neg Hx     Medications- reviewed and updated Current Outpatient Medications  Medication Sig Dispense Refill  . fluticasone (FLONASE) 50 MCG/ACT nasal spray Place 1 spray into both nostrils daily. 16 g 1   No current facility-administered medications for this visit.    Allergies-reviewed and updated No Known Allergies  Social History   Socioeconomic History  . Marital status: Married    Spouse name: Not on file  . Number of children: Not on file  . Years of education: Not on file  . Highest education level: Not on file  Occupational History  . Not on file  Tobacco Use  . Smoking status: Former Research scientist (life sciences)  . Smokeless tobacco: Never Used  . Tobacco comment: 4 years about a pack per day  Substance and Sexual Activity  . Alcohol use: Yes  . Drug use: Never  . Sexual activity: Yes  Other Topics Concern  . Not on file  Social History Narrative  . Not on file   Social Determinants of Health   Financial Resource Strain:   . Difficulty of Paying Living Expenses: Not on file  Food Insecurity:   . Worried  About Charity fundraiser in the Last Year: Not on file  . Ran Out of Food in the Last Year: Not on file  Transportation Needs:   . Lack of Transportation (Medical): Not on file  . Lack of Transportation (Non-Medical): Not on file  Physical Activity:   . Days of Exercise per Week: Not on file  . Minutes of Exercise per Session: Not on file  Stress:   . Feeling of Stress : Not on file  Social Connections:   . Frequency of Communication with Friends and Family: Not on file  . Frequency of Social Gatherings with Friends and Family: Not on file  . Attends Religious Services: Not on file  . Active Member of Clubs or Organizations: Not on file  . Attends Archivist Meetings: Not on file  . Marital Status: Not on file        Objective:  Physical Exam: BP 124/80   Pulse 63   Temp (!) 97.2 F (36.2 C)   Ht 6' (1.829 m)   Wt 195 lb 4 oz (88.6 kg)   SpO2 98%   BMI 26.48 kg/m   Body mass index is 26.48 kg/m. Wt Readings from Last 3 Encounters:  09/04/19 195 lb 4 oz (88.6 kg)  08/29/18 200 lb 4 oz (90.8 kg)  08/21/18 206 lb 4 oz (93.6 kg)   Gen: NAD, resting comfortably HEENT: TMs with clear effusion bilaterally. OP clear. No thyromegaly noted.  CV: RRR with no murmurs appreciated Pulm: NWOB, CTAB with no crackles, wheezes, or rhonchi GI: Normal bowel sounds present. Soft, Nontender, Nondistended. MSK: no edema, cyanosis, or clubbing noted Skin: warm, dry Neuro: CN2-12 grossly intact. Strength 5/5 in upper and lower extremities. Reflexes symmetric and intact bilaterally.  Psych: Normal affect and thought content     Lashanda Storlie M. Jerline Pain, MD 09/04/2019 9:01 AM

## 2019-09-07 LAB — HEPATITIS C ANTIBODY
Hepatitis C Ab: NONREACTIVE
SIGNAL TO CUT-OFF: 0.01 (ref <1.00)

## 2019-09-07 LAB — HEPATITIS B SURFACE ANTIBODY,QUALITATIVE: Hep B S Ab: NONREACTIVE

## 2019-09-07 LAB — HEPATITIS B SURFACE ANTIGEN: Hepatitis B Surface Ag: NONREACTIVE

## 2019-09-07 NOTE — Progress Notes (Signed)
Please inform patient of the following:  Covid antibody is positive - this means he has been exposed to the virus.  Liver numbers look much better and are almost back to normal. The rest of his blood work is stable. Would like like for him to keep up the good work and we can recheck in a year or so.  Roy Cervantes. [name], MD 09/07/2019 3:43 PM

## 2020-06-11 ENCOUNTER — Ambulatory Visit: Payer: 59 | Attending: Internal Medicine

## 2020-06-11 DIAGNOSIS — Z23 Encounter for immunization: Secondary | ICD-10-CM

## 2020-06-11 NOTE — Progress Notes (Signed)
   Covid-19 Vaccination Clinic  Name:  Roy Cervantes    MRN: [id number] DOB: 05-26-1981  06/11/2020  Mr. Candy was observed post Covid-19 immunization for 15 minutes without incident. He was provided with Vaccine Information Sheet and instruction to access the V-Safe system.   [name] was instructed to call 911 with any severe reactions post vaccine: Marland Kitchen Difficulty breathing  . Swelling of face and throat  . A fast heartbeat  . A bad rash all over body  . Dizziness and weakness

## 2020-09-09 ENCOUNTER — Other Ambulatory Visit: Payer: Self-pay

## 2020-09-09 ENCOUNTER — Ambulatory Visit (INDEPENDENT_AMBULATORY_CARE_PROVIDER_SITE_OTHER): Payer: 59 | Admitting: Family Medicine

## 2020-09-09 ENCOUNTER — Encounter: Payer: Self-pay | Admitting: Family Medicine

## 2020-09-09 VITALS — BP 114/74 | HR 71 | Temp 97.7°F | Ht 72.0 in | Wt 175.2 lb

## 2020-09-09 DIAGNOSIS — R7989 Other specified abnormal findings of blood chemistry: Secondary | ICD-10-CM

## 2020-09-09 DIAGNOSIS — E785 Hyperlipidemia, unspecified: Secondary | ICD-10-CM

## 2020-09-09 DIAGNOSIS — R059 Cough, unspecified: Secondary | ICD-10-CM

## 2020-09-09 DIAGNOSIS — Z0001 Encounter for general adult medical examination with abnormal findings: Secondary | ICD-10-CM | POA: Diagnosis not present

## 2020-09-09 LAB — CBC
HCT: 46.4 % (ref 39.0–52.0)
Hemoglobin: 16.1 g/dL (ref 13.0–17.0)
MCHC: 34.7 g/dL (ref 30.0–36.0)
MCV: 89.1 fl (ref 78.0–100.0)
Platelets: 316 10*3/uL (ref 150.0–400.0)
RBC: 5.2 Mil/uL (ref 4.22–5.81)
RDW: 12.9 % (ref 11.5–15.5)
WBC: 6.9 10*3/uL (ref 4.0–10.5)

## 2020-09-09 LAB — COMPREHENSIVE METABOLIC PANEL
ALT: 31 U/L (ref 0–53)
AST: 20 U/L (ref 0–37)
Albumin: 4.6 g/dL (ref 3.5–5.2)
Alkaline Phosphatase: 62 U/L (ref 39–117)
BUN: 15 mg/dL (ref 6–23)
CO2: 28 mEq/L (ref 19–32)
Calcium: 9.7 mg/dL (ref 8.4–10.5)
Chloride: 105 mEq/L (ref 96–112)
Creatinine, Ser: 1.01 mg/dL (ref 0.40–1.50)
GFR: 93.06 mL/min (ref >60.00)
Glucose, Bld: 88 mg/dL (ref 70–99)
Potassium: 4.5 mEq/L (ref 3.5–5.1)
Sodium: 139 mEq/L (ref 135–145)
Total Bilirubin: 0.7 mg/dL (ref 0.2–1.2)
Total Protein: 7.6 g/dL (ref 6.0–8.3)

## 2020-09-09 LAB — LIPID PANEL
Cholesterol: 168 mg/dL (ref 0–200)
HDL: 49.5 mg/dL (ref >39.00)
LDL Cholesterol: 104 mg/dL — ABNORMAL HIGH (ref 0–99)
NonHDL: 118.31
Total CHOL/HDL Ratio: 3
Triglycerides: 70 mg/dL (ref 0.0–149.0)
VLDL: 14 mg/dL (ref 0.0–40.0)

## 2020-09-09 LAB — SARS-COV-2 IGG: SARS-COV-2 IgG: 4.53

## 2020-09-09 LAB — TSH: TSH: 2.17 u[IU]/mL (ref 0.35–4.50)

## 2020-09-09 NOTE — Assessment & Plan Note (Signed)
Congratulated patient on weight loss.  Check labs today.

## 2020-09-09 NOTE — Progress Notes (Signed)
Chief Complaint:  Roy Cervantes is a 40 y.o. male who presents today for his annual comprehensive physical exam.    Assessment/Plan:  New/Acute Problems: Skin Lesion No obvious abnormalities on exam today.  He can take a picture and send me on MyChart when it comes back.  Possibly tinea versicolor.  Chronic Problems Addressed Today: Dyslipidemia Congratulated patient on weight loss.  Check labs today.  Elevated LFTs Check CMET.  Preventative Healthcare: Check labs.  Up-to-date on vaccines.  Patient Counseling(The following topics were reviewed and/or handout was given):  -Nutrition: Stressed importance of moderation in sodium/caffeine intake, saturated fat and cholesterol, caloric balance, sufficient intake of fresh fruits, vegetables, and fiber.  -Stressed the importance of regular exercise.   -Substance Abuse: Discussed cessation/primary prevention of tobacco, alcohol, or other drug use; driving or other dangerous activities under the influence; availability of treatment for abuse.   -Injury prevention: Discussed safety belts, safety helmets, smoke detector, smoking near bedding or upholstery.   -Sexuality: Discussed sexually transmitted diseases, partner selection, use of condoms, avoidance of unintended pregnancy and contraceptive alternatives.   -Dental health: Discussed importance of regular tooth brushing, flossing, and dental visits.  -Health maintenance and immunizations reviewed. Please refer to Health maintenance section.  Return to care in 1 year for next preventative visit.     Subjective:  HPI:  He has no acute complaints today.  Occasionally gets a skin lesion on arm and shoulder.  Occurs only when he is exposed to sunlight.  No symptoms currently.  Lifestyle Diet: Balanced.  Exercise: Active with work.   Depression screen PHQ 2/9 09/09/2020  Decreased Interest 0  Down, Depressed, Hopeless 0  PHQ - 2 Score 0    Health Maintenance Due  Topic Date Due  .  COVID-19 Vaccine (2 - Booster for Janssen series) 08/06/2020     ROS: Per HPI, otherwise a complete review of systems was negative.   PMH:  The following were reviewed and entered/updated in epic: History reviewed. No pertinent past medical history. Patient Active Problem List   Diagnosis Date Noted  . Allergic rhinitis 09/04/2019  . Elevated LFTs 09/01/2018  . Dyslipidemia 09/01/2018   History reviewed. No pertinent surgical history.  Family History  Problem Relation Age of Onset  . Liver disease Mother   . Mesothelioma Maternal Grandfather   . Heart disease Neg Hx     Medications- reviewed and updated No current outpatient medications on file.   No current facility-administered medications for this visit.    Allergies-reviewed and updated No Known Allergies  Social History   Socioeconomic History  . Marital status: Married    Spouse name: Not on file  . Number of children: Not on file  . Years of education: Not on file  . Highest education level: Not on file  Occupational History  . Not on file  Tobacco Use  . Smoking status: Former Games developer  . Smokeless tobacco: Never Used  . Tobacco comment: 4 years about a pack per day  Substance and Sexual Activity  . Alcohol use: Yes  . Drug use: Never  . Sexual activity: Yes  Other Topics Concern  . Not on file  Social History Narrative  . Not on file   Social Determinants of Health   Financial Resource Strain: Not on file  Food Insecurity: Not on file  Transportation Needs: Not on file  Physical Activity: Not on file  Stress: Not on file  Social Connections: Not on file  Objective:  Physical Exam: BP 114/74   Pulse 71   Temp 97.7 F (36.5 C) (Temporal)   Ht 6' (1.829 m)   Wt 175 lb 3.2 oz (79.5 kg)   SpO2 99%   BMI 23.76 kg/m   Body mass index is 23.76 kg/m. Wt Readings from Last 3 Encounters:  09/09/20 175 lb 3.2 oz (79.5 kg)  09/04/19 195 lb 4 oz (88.6 kg)  08/29/18 200 lb 4 oz (90.8  kg)   Gen: NAD, resting comfortably HEENT: TMs normal bilaterally. OP clear. No thyromegaly noted.  CV: RRR with no murmurs appreciated Pulm: NWOB, CTAB with no crackles, wheezes, or rhonchi GI: Normal bowel sounds present. Soft, Nontender, Nondistended. MSK: no edema, cyanosis, or clubbing noted Skin: warm, dry Neuro: CN2-12 grossly intact. Strength 5/5 in upper and lower extremities. Reflexes symmetric and intact bilaterally.  Psych: Normal affect and thought content     Roy Cervantes M. [name], MD 09/09/2020 8:39 AM

## 2020-09-09 NOTE — Assessment & Plan Note (Signed)
Check CMET. 

## 2020-09-09 NOTE — Patient Instructions (Signed)
It was very nice to see you today!  Keep up the good work!  We will check blood work today.  I will see you back in a year.  Please come back to see me sooner if needed.  Take care, Dr [name]  Please try these tips to maintain a healthy lifestyle:   Eat at least 3 REAL meals and 1-2 snacks per day.  Aim for no more than 5 hours between eating.  If you eat breakfast, please do so within one hour of getting up.    Each meal should contain half fruits/vegetables, one quarter protein, and one quarter carbs (no bigger than a computer mouse)   Cut down on sweet beverages. This includes juice, soda, and sweet tea.     Drink at least 1 glass of water with each meal and aim for at least 8 glasses per day   Exercise at least 150 minutes every week.    Preventive Care 41-41 Years Old, Male Preventive care refers to lifestyle choices and visits with your health care provider that can promote health and wellness. This includes:  A yearly physical exam. This is also called an annual wellness visit.  Regular dental and eye exams.  Immunizations.  Screening for certain conditions.  Healthy lifestyle choices, such as: ? Eating a healthy diet. ? Getting regular exercise. ? Not using drugs or products that contain nicotine and tobacco. ? Limiting alcohol use. What can I expect for my preventive care visit? Physical exam Your health care provider will check your:  Height and weight. These may be used to calculate your BMI (body mass index). BMI is a measurement that tells if you are at a healthy weight.  Heart rate and blood pressure.  Body temperature.  Skin for abnormal spots. Counseling Your health care provider may ask you questions about your:  Past medical problems.  Family's medical history.  Alcohol, tobacco, and drug use.  Emotional well-being.  Home life and relationship well-being.  Sexual activity.  Diet, exercise, and sleep habits.  Work and work  Astronomer.  Access to firearms. What immunizations do I need? Vaccines are usually given at various ages, according to a schedule. Your health care provider will recommend vaccines for you based on your age, medical history, and lifestyle or other factors, such as travel or where you work.   What tests do I need? Blood tests  Lipid and cholesterol levels. These may be checked every 5 years, or more often if you are over 58 years old.  Hepatitis C test.  Hepatitis B test. Screening  Lung cancer screening. You may have this screening every year starting at age 85 if you have a 30-pack-year history of smoking and currently smoke or have quit within the past 15 years.  Prostate cancer screening. Recommendations will vary depending on your family history and other risks.  Genital exam to check for testicular cancer or hernias.  Colorectal cancer screening. ? All adults should have this screening starting at age 34 and continuing until age 29. ? Your health care provider may recommend screening at age 74 if you are at increased risk. ? You will have tests every 1-10 years, depending on your results and the type of screening test.  Diabetes screening. ? This is done by checking your blood sugar (glucose) after you have not eaten for a while (fasting). ? You may have this done every 1-3 years.  STD (sexually transmitted disease) testing, if you are at risk. Follow these  instructions at home: Eating and drinking  Eat a diet that includes fresh fruits and vegetables, whole grains, lean protein, and low-fat dairy products.  Take vitamin and mineral supplements as recommended by your health care provider.  Do not drink alcohol if your health care provider tells you not to drink.  If you drink alcohol: ? Limit how much you have to 0-2 drinks a day. ? Be aware of how much alcohol is in your drink. In the [place] one drink equals one 12 oz bottle of beer (355 mL), one 5 oz glass of wine  (148 mL), or one 1 oz glass of hard liquor (44 mL).   Lifestyle  Take daily care of your teeth and gums. Brush your teeth every morning and night with fluoride toothpaste. Floss one time each day.  Stay active. Exercise for at least 30 minutes 5 or more days each week.  Do not use any products that contain nicotine or tobacco, such as cigarettes, e-cigarettes, and chewing tobacco. If you need help quitting, ask your health care provider.  Do not use drugs.  If you are sexually active, practice safe sex. Use a condom or other form of protection to prevent STIs (sexually transmitted infections).  If told by your health care provider, take low-dose aspirin daily starting at age 51.  Find healthy ways to cope with stress, such as: ? Meditation, yoga, or listening to music. ? Journaling. ? Talking to a trusted person. ? Spending time with friends and family. Safety  Always wear your seat belt while driving or riding in a vehicle.  Do not drive: ? If you have been drinking alcohol. Do not ride with someone who has been drinking. ? When you are tired or distracted. ? While texting.  Wear a helmet and other protective equipment during sports activities.  If you have firearms in your house, make sure you follow all gun safety procedures. What's next?  Go to your health care provider once a year for an annual wellness visit.  Ask your health care provider how often you should have your eyes and teeth checked.  Stay up to date on all vaccines. This information is not intended to replace advice given to you by your health care provider. Make sure you discuss any questions you have with your health care provider. Document Revised: 04/28/2019 Document Reviewed: 07/24/2018 Elsevier Patient Education  2021 [employer].

## 2020-09-12 NOTE — Progress Notes (Signed)
Please inform patient of the following:  His "bad" cholesterol is still just mildly elevated but better than last year. Everything else is NORMAL. Do not need to make any changes to his treatment plan at this time. We can recheck in a year or so.  Roy Cervantes. [name], MD 09/12/2020 8:31 AM

## 2020-12-09 IMAGING — DX DG LUMBAR SPINE 2-3V
3 series · 3 of 3 positions shown · non-contrast
Comparison: None.

CLINICAL DATA: Low back pain with some radiation intermittently to
the lower extremities, no injury

EXAM:
LUMBAR SPINE - 2-3 VIEW

[lumbar spine ap]
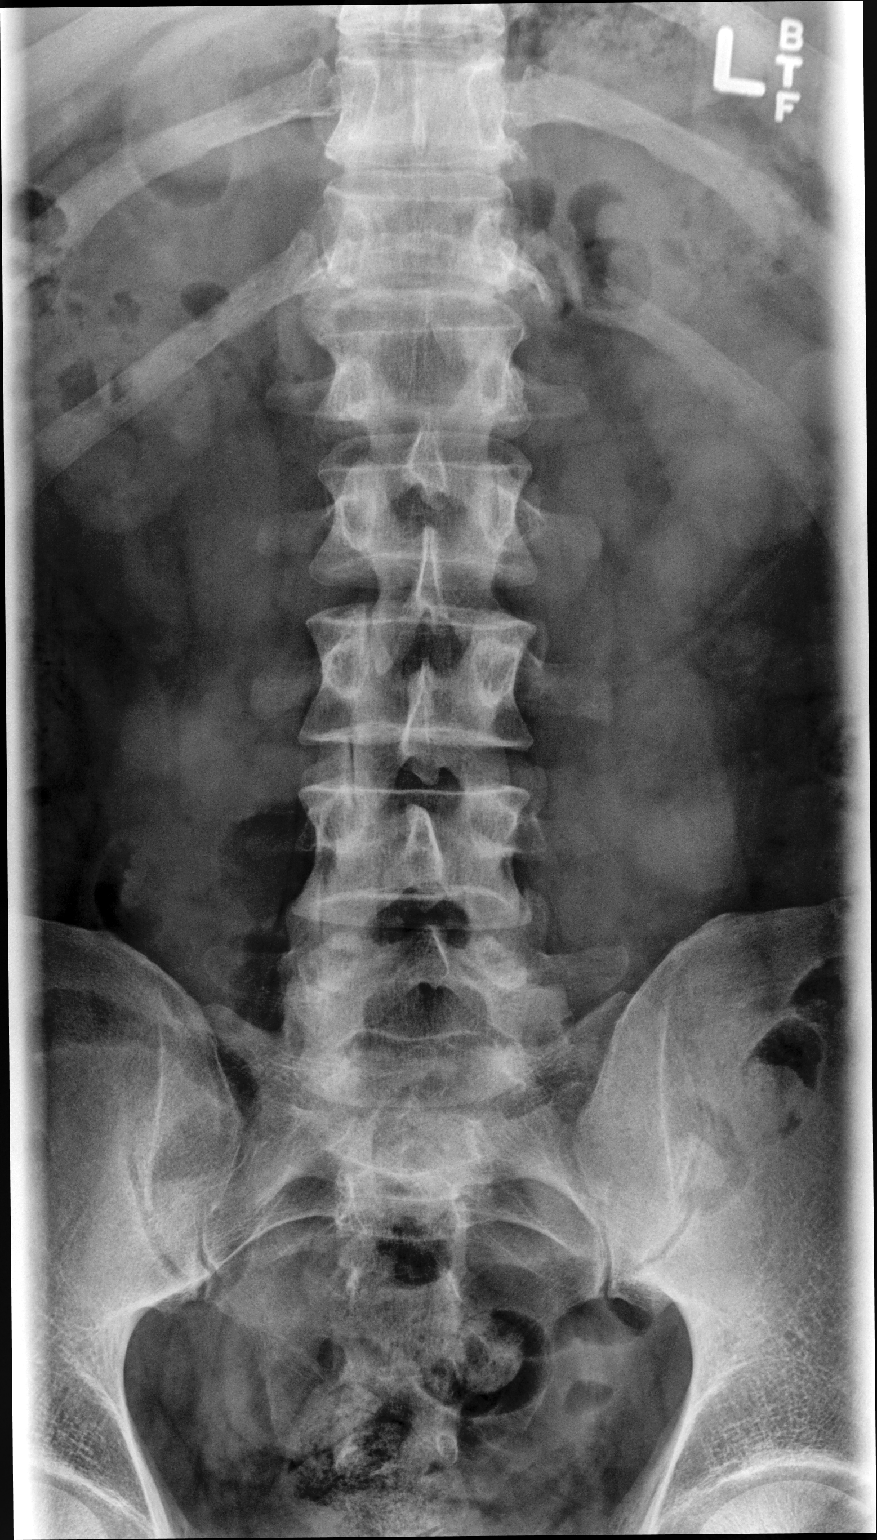

[lumbar spine lat (1 of 2)]
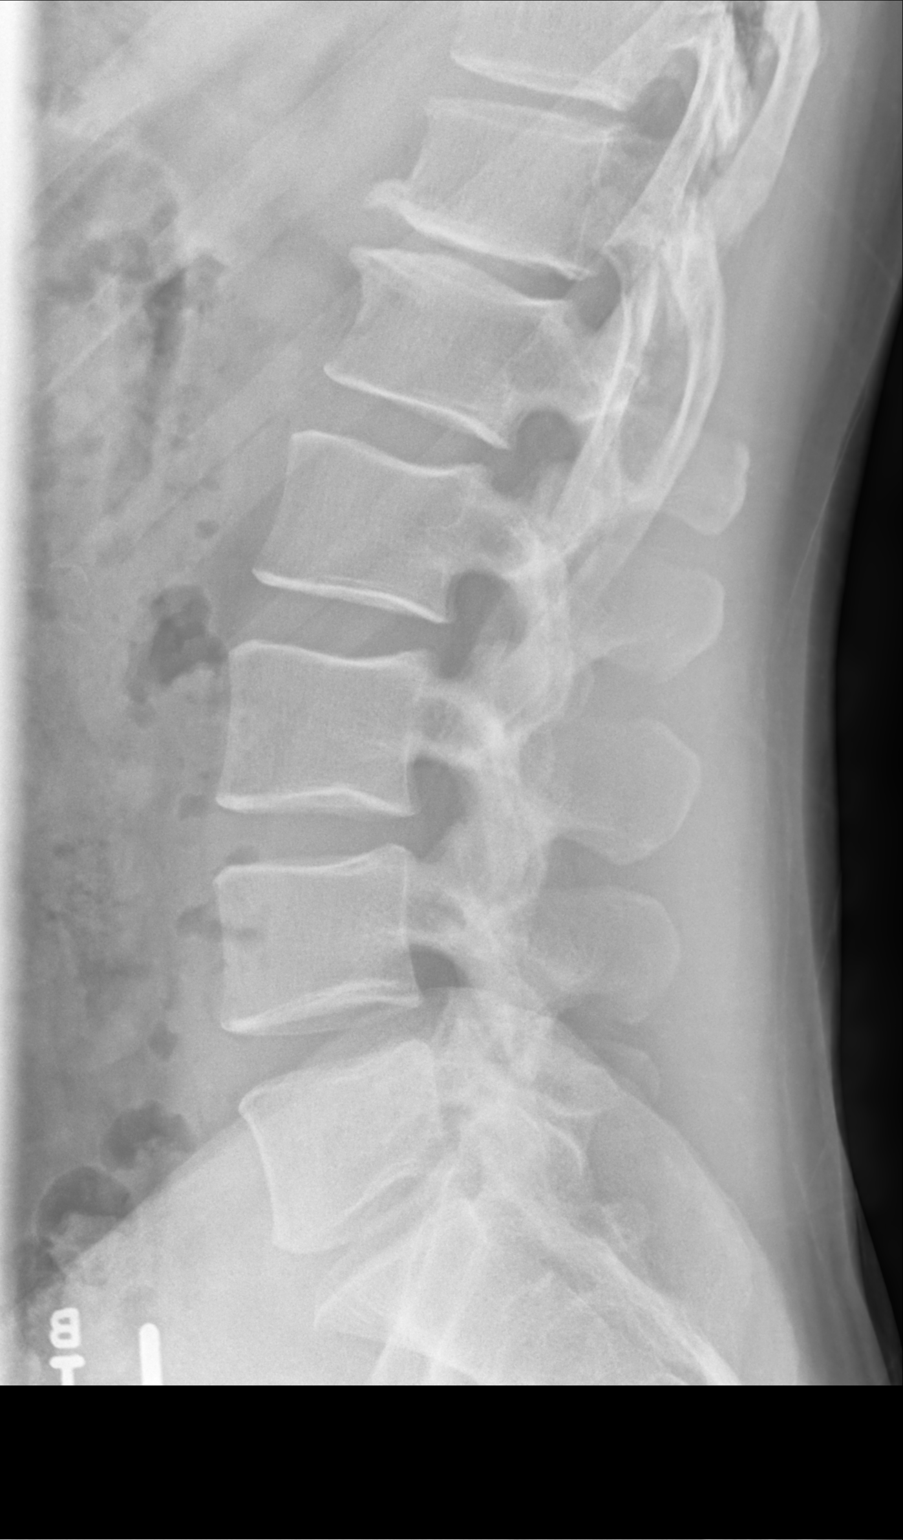

[lumbar spine lat (2 of 2)]
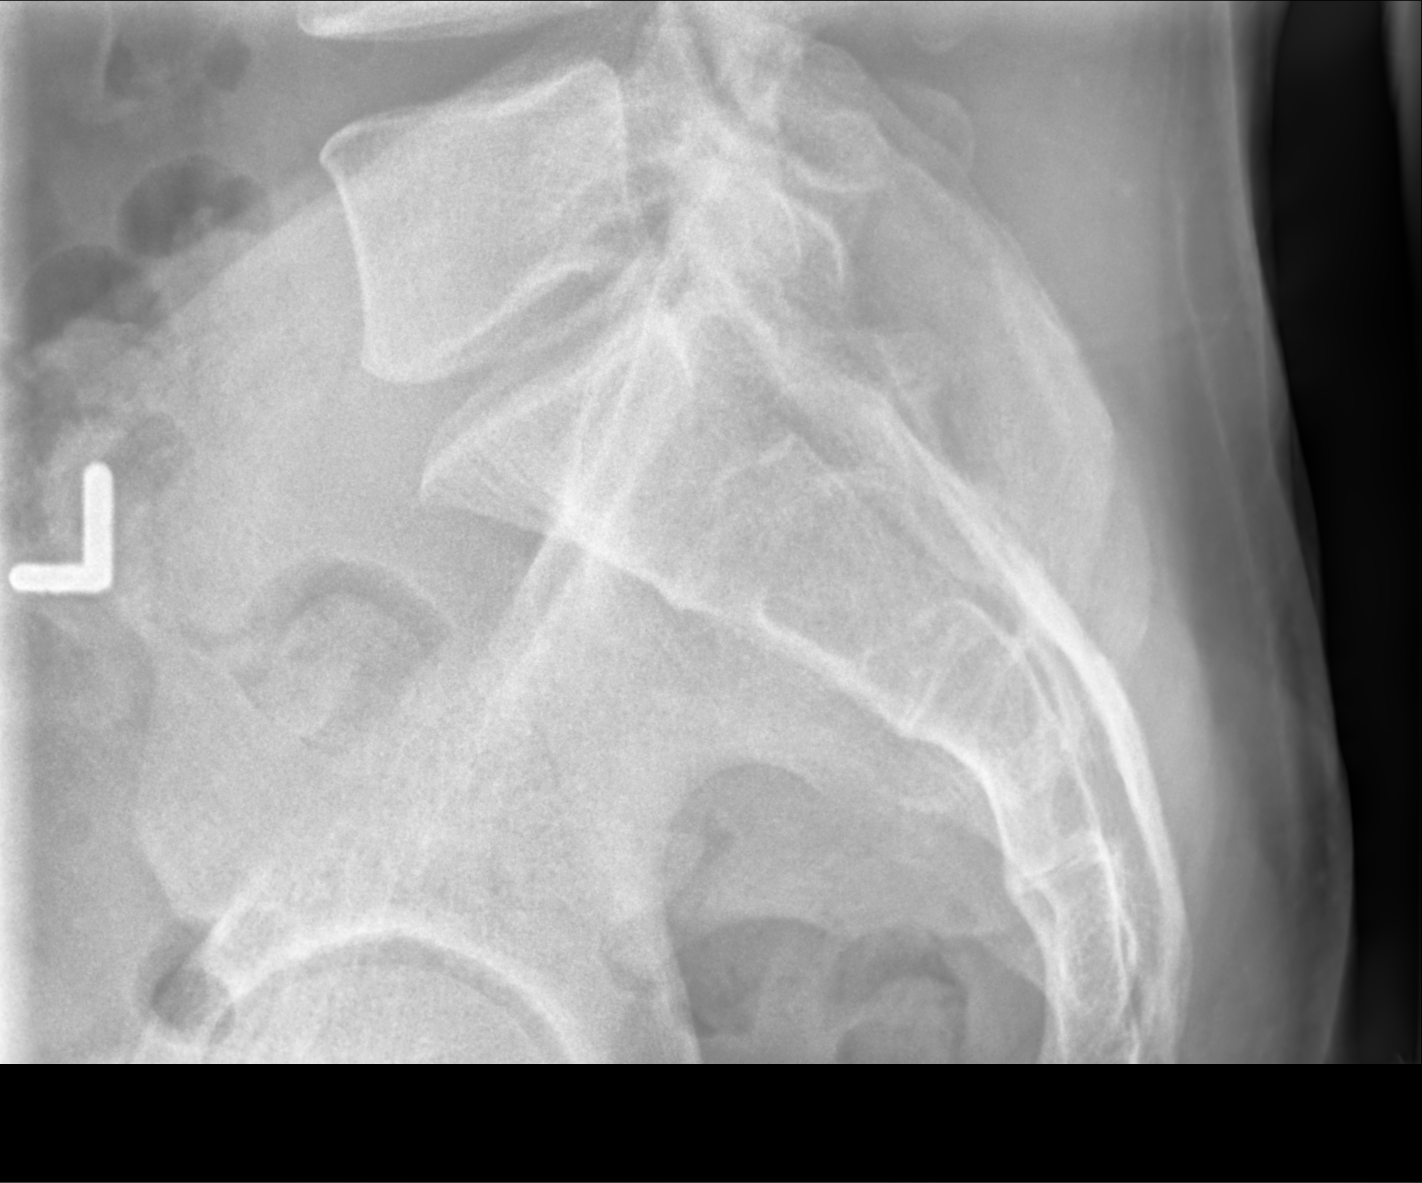

[3 of 3 positions shown; findings below may reference images not displayed]

FINDINGS: The lumbar vertebrae are in normal alignment. Intervertebral disc
spaces appear normal. No compression deformity is seen. Very mild
degenerative change is noted at the T12-L1 level with some spurring
and mild sclerosis with slight loss of disc space.
IMPRESSION: 1. Normal alignment of the lumbar vertebrae with normal disc spaces.
2. Mild to moderate degenerative disc disease of T12-L1.

## 2020-12-26 ENCOUNTER — Ambulatory Visit (INDEPENDENT_AMBULATORY_CARE_PROVIDER_SITE_OTHER): Payer: 59 | Admitting: Family Medicine

## 2020-12-26 ENCOUNTER — Other Ambulatory Visit: Payer: Self-pay

## 2020-12-26 ENCOUNTER — Encounter: Payer: Self-pay | Admitting: Family Medicine

## 2020-12-26 VITALS — BP 116/74 | HR 70 | Temp 98.4°F | Resp 16 | Ht 72.0 in | Wt 172.8 lb

## 2020-12-26 DIAGNOSIS — K148 Other diseases of tongue: Secondary | ICD-10-CM | POA: Diagnosis not present

## 2020-12-26 MED ORDER — CLOTRIMAZOLE 10 MG MT TROC: 10.0000 mg | Freq: Every day | OROMUCOSAL | 0 refills | Status: DC

## 2020-12-26 NOTE — Patient Instructions (Addendum)
Try Mycelex lozenges 5 times per day for the next 2 weeks.  That should treat thrush if that is the cause.  If white patches persist, follow-up myself or Dr. [name] to evaluate for the other causes as we discussed.  If any worsening symptoms or new symptoms during that time, follow-up sooner.  Thanks for coming in today and please let me know if there are questions.

## 2020-12-26 NOTE — Progress Notes (Signed)
Subjective:  Patient ID: Roy Cervantes, male    DOB: Sep 10, 1980  Age: 40 y.o. MRN: [id number]  CC:  Chief Complaint  Patient presents with  . Discoloration of tongue    Pt notes yellow tinge to tongue for one week with bad taste in his mouth, no change in diet or oral routine, denies drugs or smoking at this time but notes has had elevated liver enzyme.     HPI Roy Cervantes presents for   Tongue discoloration Started with bad taste about a week ago. then yellow tinge to tongue noted since that time.  Tried to brush tongue past week, swish with hydrogen peroxide and water, other swish form wife (she works for [employer]).  No change in taste/smell.  No tongue burning/soreness/sensitivity.  No fever, no unexplained wt loss or night sweats  No worsening of symptoms, but  History of elevated LFTs but were normal on his January labs. Concerned about cirrhosis - had in family. Has cut back on fatty foods, sugary foods.  1 cup coffee per day.  No recent tooth pain or gum swelling.  No recent antibiotics.  Unknown if recent mouth burn.  Uses flonase nasal spray for allergies.  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   Lab Results  Component Value Date   ALT 31 09/09/2020   AST 20 09/09/2020   ALKPHOS 62 09/09/2020   BILITOT 0.7 09/09/2020     History Patient Active Problem List   Diagnosis Date Noted  . Allergic rhinitis 09/04/2019  . Elevated LFTs 09/01/2018  . Dyslipidemia 09/01/2018   No past medical history on file. No past surgical history on file. No Known Allergies Prior to Admission medications   Not on File   Social History   Socioeconomic History  . Marital status: Married    Spouse name: Not on file  . Number of children: Not on file  . Years of education: Not on file  . Highest education level: Not on file  Occupational History  . Not on file  Tobacco Use  . Smoking status: Former Games developer  . Smokeless tobacco: Never Used  . Tobacco comment:  4 years about a pack per day  Substance and Sexual Activity  . Alcohol use: Yes  . Drug use: Never  . Sexual activity: Yes  Other Topics Concern  . Not on file  Social History Narrative  . Not on file   Social Determinants of Health   Financial Resource Strain: Not on file  Food Insecurity: Not on file  Transportation Needs: Not on file  Physical Activity: Not on file  Stress: Not on file  Social Connections: Not on file  Intimate Partner Violence: Not on file    Review of Systems   Objective:   Vitals:   12/26/20 1520  BP: 116/74  Pulse: 70  Resp: 16  Temp: 98.4 F (36.9 C)  TempSrc: Temporal  SpO2: 95%  Weight: 172 lb 12.8 oz (78.4 kg)  Height: 6' (1.829 m)     Physical Exam Constitutional:      General: He is not in acute distress.    Appearance: He is well-developed.  HENT:     Head: Normocephalic and atraumatic.     Mouth/Throat:     Mouth: Mucous membranes are moist.     Pharynx: No oropharyngeal exudate.     Comments: Slight white coating across the dorsum of the tongue, primarily posterior two thirds.  Minimal scraped with tongue depressor, some residual  white coating.  Buccal mucosa uninvolved, no apparent periodontal abscess or significant gum erythema/edema.  No lymphadenopathy appreciated on the neck, nontender submandibular glands, parotid glands.  No apparent swelling.   Eyes:     Conjunctiva/sclera: Conjunctivae normal.     Comments: No scleral icterus  Cardiovascular:     Rate and Rhythm: Normal rate.  Pulmonary:     Effort: Pulmonary effort is normal.  Skin:    Comments: No apparent jaundice.  Neurological:     Mental Status: He is alert and oriented to person, place, and time.     Assessment & Plan:  Roy Cervantes is a 40 y.o. male . Tongue discoloration - Plan: clotrimazole (MYCELEX) 10 MG troche, POCT Skin KOH  -Possible thrush versus leukoplakia.  Some scraping as above with minimal effective scraping.  Less likely glossitis  as no pain/discomfort.  No apparent signs of geographic tongue.  Trial of Mycelex troches initially.  2-week trial.  Follow-up with myself or his PCP at that time if no improvement, sooner if new or worsening symptoms.   Meds ordered this encounter  Medications  . clotrimazole (MYCELEX) 10 MG troche    Sig: Take 1 tablet (10 mg total) by mouth 5 (five) times daily.    Dispense:  70 Troche    Refill:  0   Patient Instructions  Try Mycelex lozenges 5 times per day for the next 2 weeks.  That should treat thrush if that is the cause.  If white patches persist, follow-up myself or Dr. [name] to evaluate for the other causes as we discussed.  If any worsening symptoms or new symptoms during that time, follow-up sooner.  Thanks for coming in today and please let me know if there are questions.       Signed, Roy Staggers, MD [facility]

## 2021-09-11 ENCOUNTER — Ambulatory Visit (INDEPENDENT_AMBULATORY_CARE_PROVIDER_SITE_OTHER): Payer: 59 | Admitting: Family Medicine

## 2021-09-11 ENCOUNTER — Encounter: Payer: Self-pay | Admitting: Family Medicine

## 2021-09-11 ENCOUNTER — Other Ambulatory Visit: Payer: Self-pay

## 2021-09-11 VITALS — BP 117/74 | HR 72 | Temp 98.1°F | Ht 72.0 in | Wt 167.0 lb

## 2021-09-11 DIAGNOSIS — Z0001 Encounter for general adult medical examination with abnormal findings: Secondary | ICD-10-CM | POA: Diagnosis not present

## 2021-09-11 DIAGNOSIS — R7989 Other specified abnormal findings of blood chemistry: Secondary | ICD-10-CM

## 2021-09-11 DIAGNOSIS — L729 Follicular cyst of the skin and subcutaneous tissue, unspecified: Secondary | ICD-10-CM | POA: Diagnosis not present

## 2021-09-11 DIAGNOSIS — E785 Hyperlipidemia, unspecified: Secondary | ICD-10-CM

## 2021-09-11 LAB — COMPREHENSIVE METABOLIC PANEL
ALT: 23 U/L (ref 0–53)
AST: 21 U/L (ref 0–37)
Albumin: 4.3 g/dL (ref 3.5–5.2)
Alkaline Phosphatase: 60 U/L (ref 39–117)
BUN: 14 mg/dL (ref 6–23)
CO2: 28 mEq/L (ref 19–32)
Calcium: 9.4 mg/dL (ref 8.4–10.5)
Chloride: 105 mEq/L (ref 96–112)
Creatinine, Ser: 0.95 mg/dL (ref 0.40–1.50)
GFR: 99.45 mL/min (ref >60.00)
Glucose, Bld: 94 mg/dL (ref 70–99)
Potassium: 4.5 mEq/L (ref 3.5–5.1)
Sodium: 139 mEq/L (ref 135–145)
Total Bilirubin: 0.5 mg/dL (ref 0.2–1.2)
Total Protein: 7.4 g/dL (ref 6.0–8.3)

## 2021-09-11 LAB — LIPID PANEL
Cholesterol: 159 mg/dL (ref 0–200)
HDL: 44.4 mg/dL (ref >39.00)
LDL Cholesterol: 100 mg/dL — ABNORMAL HIGH (ref 0–99)
NonHDL: 114.66
Total CHOL/HDL Ratio: 4
Triglycerides: 73 mg/dL (ref 0.0–149.0)
VLDL: 14.6 mg/dL (ref 0.0–40.0)

## 2021-09-11 LAB — TSH: TSH: 2.79 u[IU]/mL (ref 0.35–5.50)

## 2021-09-11 LAB — CBC
HCT: 46.2 % (ref 39.0–52.0)
Hemoglobin: 15.7 g/dL (ref 13.0–17.0)
MCHC: 34.1 g/dL (ref 30.0–36.0)
MCV: 90 fl (ref 78.0–100.0)
Platelets: 297 10*3/uL (ref 150.0–400.0)
RBC: 5.14 Mil/uL (ref 4.22–5.81)
RDW: 12.4 % (ref 11.5–15.5)
WBC: 5.3 10*3/uL (ref 4.0–10.5)

## 2021-09-11 NOTE — Assessment & Plan Note (Signed)
Check labs 

## 2021-09-11 NOTE — Patient Instructions (Signed)
It was very nice to see you today!  We will check blood work today.  Please continue working on diet and exercise.  I will refer you to see dermatologist.  Come back in 1 year for your next physical.  Come back sooner if needed.  Take care, Dr Jerline Pain  PLEASE NOTE:  If you had any lab tests please let us know if you have not heard back within a few days. You may see your results on mychart before we have a chance to review them but we will give you a call once they are reviewed by [place]. If we ordered any referrals today, please let us know if you have not heard from their office within the next week.   Please try these tips to maintain a healthy lifestyle:  Eat at least 3 REAL meals and 1-2 snacks per day.  Aim for no more than 5 hours between eating.  If you eat breakfast, please do so within one hour of getting up.   Each meal should contain half fruits/vegetables, one quarter protein, and one quarter carbs (no bigger than a computer mouse)  Cut down on sweet beverages. This includes juice, soda, and sweet tea.   Drink at least 1 glass of water with each meal and aim for at least 8 glasses per day  Exercise at least 150 minutes every week.    Preventive Care 84-42 Years Old, Male Preventive care refers to lifestyle choices and visits with your health care provider that can promote health and wellness. Preventive care visits are also called wellness exams. What can I expect for my preventive care visit? Counseling During your preventive care visit, your health care provider may ask about your: Medical history, including: Past medical problems. Family medical history. Current health, including: Emotional well-being. Home life and relationship well-being. Sexual activity. Lifestyle, including: Alcohol, nicotine or tobacco, and drug use. Access to firearms. Diet, exercise, and sleep habits. Safety issues such as seatbelt and bike helmet use. Sunscreen use. Work and work  Statistician. Physical exam Your health care provider will check your: Height and weight. These may be used to calculate your BMI (body mass index). BMI is a measurement that tells if you are at a healthy weight. Waist circumference. This measures the distance around your waistline. This measurement also tells if you are at a healthy weight and may help predict your risk of certain diseases, such as type 2 diabetes and high blood pressure. Heart rate and blood pressure. Body temperature. Skin for abnormal spots. What immunizations do I need? Vaccines are usually given at various ages, according to a schedule. Your health care provider will recommend vaccines for you based on your age, medical history, and lifestyle or other factors, such as travel or where you work. What tests do I need? Screening Your health care provider may recommend screening tests for certain conditions. This may include: Lipid and cholesterol levels. Diabetes screening. This is done by checking your blood sugar (glucose) after you have not eaten for a while (fasting). Hepatitis B test. Hepatitis C test. HIV (human immunodeficiency virus) test. STI (sexually transmitted infection) testing, if you are at risk. Lung cancer screening. Prostate cancer screening. Colorectal cancer screening. Talk with your health care provider about your test results, treatment options, and if necessary, the need for more tests. Follow these instructions at home: Eating and drinking  Eat a diet that includes fresh fruits and vegetables, whole grains, lean protein, and low-fat dairy products. Take vitamin and  mineral supplements as recommended by your health care provider. Do not drink alcohol if your health care provider tells you not to drink. If you drink alcohol: Limit how much you have to 0-2 drinks a day. Know how much alcohol is in your drink. In the [place] one drink equals one 12 oz bottle of beer (355 mL), one 5 oz glass of wine  (148 mL), or one 1 oz glass of hard liquor (44 mL). Lifestyle Brush your teeth every morning and night with fluoride toothpaste. Floss one time each day. Exercise for at least 30 minutes 5 or more days each week. Do not use any products that contain nicotine or tobacco. These products include cigarettes, chewing tobacco, and vaping devices, such as e-cigarettes. If you need help quitting, ask your health care provider. Do not use drugs. If you are sexually active, practice safe sex. Use a condom or other form of protection to prevent STIs. Take aspirin only as told by your health care provider. Make sure that you understand how much to take and what form to take. Work with your health care provider to find out whether it is safe and beneficial for you to take aspirin daily. Find healthy ways to manage stress, such as: Meditation, yoga, or listening to music. Journaling. Talking to a trusted person. Spending time with friends and family. Minimize exposure to UV radiation to reduce your risk of skin cancer. Safety Always wear your seat belt while driving or riding in a vehicle. Do not drive: If you have been drinking alcohol. Do not ride with someone who has been drinking. When you are tired or distracted. While texting. If you have been using any mind-altering substances or drugs. Wear a helmet and other protective equipment during sports activities. If you have firearms in your house, make sure you follow all gun safety procedures. What's next? Go to your health care provider once a year for an annual wellness visit. Ask your health care provider how often you should have your eyes and teeth checked. Stay up to date on all vaccines. This information is not intended to replace advice given to you by your health care provider. Make sure you discuss any questions you have with your health care provider. Document Revised: 01/25/2021 Document Reviewed: 01/25/2021 Elsevier Patient Education   [employer].

## 2021-09-11 NOTE — Progress Notes (Signed)
Chief Complaint:  Roy Cervantes is a 41 y.o. male who presents today for his annual comprehensive physical exam.    Assessment/Plan:  New/Acute Problems: Tongue Discoloration No red flags.  Likely normal variant.  Discussed oral hygiene.  He will also try probiotics.  Skin cyst Will place referral to dermatology for excision.  Chronic Problems Addressed Today: Dyslipidemia He has done a great job with lifestyle modifications.  We will check labs today.  Elevated LFTs Check labs.  Preventative Healthcare: Check labs.  Up-to-date on vaccines  Patient Counseling(The following topics were reviewed and/or handout was given):  -Nutrition: Stressed importance of moderation in sodium/caffeine intake, saturated fat and cholesterol, caloric balance, sufficient intake of fresh fruits, vegetables, and fiber.  -Stressed the importance of regular exercise.   -Substance Abuse: Discussed cessation/primary prevention of tobacco, alcohol, or other drug use; driving or other dangerous activities under the influence; availability of treatment for abuse.   -Injury prevention: Discussed safety belts, safety helmets, smoke detector, smoking near bedding or upholstery.   -Sexuality: Discussed sexually transmitted diseases, partner selection, use of condoms, avoidance of unintended pregnancy and contraceptive alternatives.   -Dental health: Discussed importance of regular tooth brushing, flossing, and dental visits.  -Health maintenance and immunizations reviewed. Please refer to Health maintenance section.  Return to care in 1 year for next preventative visit.     Subjective:  HPI:  He has no acute complaints today.   He has had 10 discoloration for the last year or so.  He has seen another physician for this as well this numbness.  Tried several things including changing mouthwash and eating more probiotics.  He has also tried scraping this time.  He has not noticed significant improvement.  No  pain.  No real change in taste or smell.  Lifestyle Diet: Balanced. Plenty of fruits and vegetables. Cutting out sugar and fried foods.  Exercise: Gets plenty of activity with work.   Depression screen PHQ 2/9 09/11/2021  Decreased Interest 0  Down, Depressed, Hopeless 0  PHQ - 2 Score 0   Health Maintenance Due  Topic Date Due   COVID-19 Vaccine (3 - Booster for Janssen series) 08/17/2020    ROS: Per HPI, otherwise a complete review of systems was negative.   PMH:  The following were reviewed and entered/updated in epic: History reviewed. No pertinent past medical history. Patient Active Problem List   Diagnosis Date Noted   Allergic rhinitis 09/04/2019   Elevated LFTs 09/01/2018   Dyslipidemia 09/01/2018   History reviewed. No pertinent surgical history.  Family History  Problem Relation Age of Onset   Liver disease Mother    Mesothelioma Maternal Grandfather    Heart disease Neg Hx     Medications- reviewed and updated No current outpatient medications on file.   No current facility-administered medications for this visit.    Allergies-reviewed and updated No Known Allergies  Social History   Socioeconomic History   Marital status: Married    Spouse name: Not on file   Number of children: Not on file   Years of education: Not on file   Highest education level: Not on file  Occupational History   Not on file  Tobacco Use   Smoking status: Former   Smokeless tobacco: Never   Tobacco comments:    4 years about a pack per day  Substance and Sexual Activity   Alcohol use: Yes   Drug use: Never   Sexual activity: Yes  Other Topics Concern  Not on file  Social History Narrative   Not on file   Social Determinants of Health   Financial Resource Strain: Not on file  Food Insecurity: Not on file  Transportation Needs: Not on file  Physical Activity: Not on file  Stress: Not on file  Social Connections: Not on file        Objective:  Physical  Exam: BP 117/74    Pulse 72    Temp 98.1 F (36.7 C)    Ht 6' (1.829 m)    Wt 167 lb (75.8 kg)    SpO2 98%    BMI 22.65 kg/m   Body mass index is 22.65 kg/m. Wt Readings from Last 3 Encounters:  09/11/21 167 lb (75.8 kg)  12/26/20 172 lb 12.8 oz (78.4 kg)  09/09/20 175 lb 3.2 oz (79.5 kg)   Gen: NAD, resting comfortably HEENT: TMs normal bilaterally. OP clear. No thyromegaly noted.  White patches on posterior tongue.  CV: RRR with no murmurs appreciated Pulm: NWOB, CTAB with no crackles, wheezes, or rhonchi GI: Normal bowel sounds present. Soft, Nontender, Nondistended. MSK: no edema, cyanosis, or clubbing noted Skin: warm, dry.  Small 1 mm cyst on left nose. Neuro: CN2-12 grossly intact. Strength 5/5 in upper and lower extremities. Reflexes symmetric and intact bilaterally.  Psych: Normal affect and thought content     Roy Cervantes M. [name], MD 09/11/2021 8:40 AM

## 2021-09-11 NOTE — Assessment & Plan Note (Signed)
He has done a great job with lifestyle modifications.  We will check labs today.

## 2021-09-12 ENCOUNTER — Telehealth: Payer: Self-pay

## 2021-09-12 NOTE — Progress Notes (Signed)
Please inform patient of the following:  Labs are all stable. Do not need to make any changes to his treatment plan at this time. Would like for him to keep working on diet and exercise and we can recheck in a year.  Katina Degree. [name], MD 09/12/2021 1:06 PM

## 2021-09-12 NOTE — Telephone Encounter (Signed)
Patient has called back for lab results.  I have read patient Dr. Rhys Martini response.   Patient understood.

## 2022-04-04 ENCOUNTER — Ambulatory Visit: Payer: 59 | Admitting: Physician Assistant

## 2022-05-07 ENCOUNTER — Encounter: Payer: Self-pay | Admitting: *Deleted

## 2022-07-26 ENCOUNTER — Encounter: Payer: Self-pay | Admitting: *Deleted

## 2022-09-13 ENCOUNTER — Ambulatory Visit (INDEPENDENT_AMBULATORY_CARE_PROVIDER_SITE_OTHER): Payer: 59 | Admitting: Family Medicine

## 2022-09-13 ENCOUNTER — Encounter: Payer: Self-pay | Admitting: Family Medicine

## 2022-09-13 VITALS — BP 117/72 | HR 51 | Temp 97.8°F | Ht 72.0 in | Wt 170.0 lb

## 2022-09-13 DIAGNOSIS — Z131 Encounter for screening for diabetes mellitus: Secondary | ICD-10-CM | POA: Diagnosis not present

## 2022-09-13 DIAGNOSIS — E785 Hyperlipidemia, unspecified: Secondary | ICD-10-CM

## 2022-09-13 DIAGNOSIS — Z0001 Encounter for general adult medical examination with abnormal findings: Secondary | ICD-10-CM | POA: Diagnosis not present

## 2022-09-13 DIAGNOSIS — J309 Allergic rhinitis, unspecified: Secondary | ICD-10-CM

## 2022-09-13 DIAGNOSIS — R7989 Other specified abnormal findings of blood chemistry: Secondary | ICD-10-CM

## 2022-09-13 LAB — COMPREHENSIVE METABOLIC PANEL
ALT: 30 U/L (ref 0–53)
AST: 23 U/L (ref 0–37)
Albumin: 4.6 g/dL (ref 3.5–5.2)
Alkaline Phosphatase: 60 U/L (ref 39–117)
BUN: 11 mg/dL (ref 6–23)
CO2: 28 mEq/L (ref 19–32)
Calcium: 9.8 mg/dL (ref 8.4–10.5)
Chloride: 106 mEq/L (ref 96–112)
Creatinine, Ser: 0.88 mg/dL (ref 0.40–1.50)
GFR: 106.09 mL/min (ref >60.00)
Glucose, Bld: 90 mg/dL (ref 70–99)
Potassium: 4.9 mEq/L (ref 3.5–5.1)
Sodium: 141 mEq/L (ref 135–145)
Total Bilirubin: 0.7 mg/dL (ref 0.2–1.2)
Total Protein: 7.3 g/dL (ref 6.0–8.3)

## 2022-09-13 LAB — LIPID PANEL
Cholesterol: 190 mg/dL (ref 0–200)
HDL: 55.9 mg/dL (ref >39.00)
LDL Cholesterol: 122 mg/dL — ABNORMAL HIGH (ref 0–99)
NonHDL: 133.95
Total CHOL/HDL Ratio: 3
Triglycerides: 58 mg/dL (ref 0.0–149.0)
VLDL: 11.6 mg/dL (ref 0.0–40.0)

## 2022-09-13 LAB — CBC
HCT: 46.8 % (ref 39.0–52.0)
Hemoglobin: 16.2 g/dL (ref 13.0–17.0)
MCHC: 34.6 g/dL (ref 30.0–36.0)
MCV: 90.7 fl (ref 78.0–100.0)
Platelets: 327 10*3/uL (ref 150.0–400.0)
RBC: 5.17 Mil/uL (ref 4.22–5.81)
RDW: 13.3 % (ref 11.5–15.5)
WBC: 5.9 10*3/uL (ref 4.0–10.5)

## 2022-09-13 LAB — HEMOGLOBIN A1C: Hgb A1c MFr Bld: 5.3 % (ref 4.6–6.5)

## 2022-09-13 LAB — TSH: TSH: 2.28 u[IU]/mL (ref 0.35–5.50)

## 2022-09-13 NOTE — Assessment & Plan Note (Signed)
Check labs.  Resolved on last check with dietary changes.

## 2022-09-13 NOTE — Patient Instructions (Signed)
It was very nice to see you today!  We will check blood work today.  I will refer you to see the ear nose and throat doctor.  Will see you back in year for your next physical.  Please come back to see [place] sooner if needed.  Take care, Dr Jerline Pain  PLEASE NOTE:  If you had any lab tests, please let us know if you have not heard back within a few days. You may see your results on mychart before we have a chance to review them but we will give you a call once they are reviewed by [place].   If we ordered any referrals today, please let us know if you have not heard from their office within the next week.   If you had any urgent prescriptions sent in today, please check with the pharmacy within an hour of our visit to make sure the prescription was transmitted appropriately.   Please try these tips to maintain a healthy lifestyle:  Eat at least 3 REAL meals and 1-2 snacks per day.  Aim for no more than 5 hours between eating.  If you eat breakfast, please do so within one hour of getting up.   Each meal should contain half fruits/vegetables, one quarter protein, and one quarter carbs (no bigger than a computer mouse)  Cut down on sweet beverages. This includes juice, soda, and sweet tea.   Drink at least 1 glass of water with each meal and aim for at least 8 glasses per day  Exercise at least 150 minutes every week.    Preventive Care 43-95 Years Old, Male Preventive care refers to lifestyle choices and visits with your health care provider that can promote health and wellness. Preventive care visits are also called wellness exams. What can I expect for my preventive care visit? Counseling During your preventive care visit, your health care provider may ask about your: Medical history, including: Past medical problems. Family medical history. Current health, including: Emotional well-being. Home life and relationship well-being. Sexual activity. Lifestyle, including: Alcohol, nicotine  or tobacco, and drug use. Access to firearms. Diet, exercise, and sleep habits. Safety issues such as seatbelt and bike helmet use. Sunscreen use. Work and work Statistician. Physical exam Your health care provider will check your: Height and weight. These may be used to calculate your BMI (body mass index). BMI is a measurement that tells if you are at a healthy weight. Waist circumference. This measures the distance around your waistline. This measurement also tells if you are at a healthy weight and may help predict your risk of certain diseases, such as type 2 diabetes and high blood pressure. Heart rate and blood pressure. Body temperature. Skin for abnormal spots. What immunizations do I need?  Vaccines are usually given at various ages, according to a schedule. Your health care provider will recommend vaccines for you based on your age, medical history, and lifestyle or other factors, such as travel or where you work. What tests do I need? Screening Your health care provider may recommend screening tests for certain conditions. This may include: Lipid and cholesterol levels. Diabetes screening. This is done by checking your blood sugar (glucose) after you have not eaten for a while (fasting). Hepatitis B test. Hepatitis C test. HIV (human immunodeficiency virus) test. STI (sexually transmitted infection) testing, if you are at risk. Lung cancer screening. Prostate cancer screening. Colorectal cancer screening. Talk with your health care provider about your test results, treatment options, and if  necessary, the need for more tests. Follow these instructions at home: Eating and drinking  Eat a diet that includes fresh fruits and vegetables, whole grains, lean protein, and low-fat dairy products. Take vitamin and mineral supplements as recommended by your health care provider. Do not drink alcohol if your health care provider tells you not to drink. If you drink alcohol: Limit  how much you have to 0-2 drinks a day. Know how much alcohol is in your drink. In the [place] one drink equals one 12 oz bottle of beer (355 mL), one 5 oz glass of wine (148 mL), or one 1 oz glass of hard liquor (44 mL). Lifestyle Brush your teeth every morning and night with fluoride toothpaste. Floss one time each day. Exercise for at least 30 minutes 5 or more days each week. Do not use any products that contain nicotine or tobacco. These products include cigarettes, chewing tobacco, and vaping devices, such as e-cigarettes. If you need help quitting, ask your health care provider. Do not use drugs. If you are sexually active, practice safe sex. Use a condom or other form of protection to prevent STIs. Take aspirin only as told by your health care provider. Make sure that you understand how much to take and what form to take. Work with your health care provider to find out whether it is safe and beneficial for you to take aspirin daily. Find healthy ways to manage stress, such as: Meditation, yoga, or listening to music. Journaling. Talking to a trusted person. Spending time with friends and family. Minimize exposure to UV radiation to reduce your risk of skin cancer. Safety Always wear your seat belt while driving or riding in a vehicle. Do not drive: If you have been drinking alcohol. Do not ride with someone who has been drinking. When you are tired or distracted. While texting. If you have been using any mind-altering substances or drugs. Wear a helmet and other protective equipment during sports activities. If you have firearms in your house, make sure you follow all gun safety procedures. What's next? Go to your health care provider once a year for an annual wellness visit. Ask your health care provider how often you should have your eyes and teeth checked. Stay up to date on all vaccines. This information is not intended to replace advice given to you by your health care  provider. Make sure you discuss any questions you have with your health care provider. Document Revised: 01/25/2021 Document Reviewed: 01/25/2021 Elsevier Patient Education  [place].

## 2022-09-13 NOTE — Progress Notes (Signed)
Chief Complaint:  Roy Cervantes is a 42 y.o. male who presents today for his annual comprehensive physical exam.    Assessment/Plan:  Chronic Problems Addressed Today: Dyslipidemia He is doing great with lifestyle modifications.  Check labs today.  Allergic rhinitis Still having quite a bit of issues especially at night.  May have mild deviated septum.  Will place referral to ENT for further management.  Elevated LFTs Check labs.  Resolved on last check with dietary changes.  Preventative Healthcare: Up-to-date on vaccines.  Check labs.  Patient Counseling(The following topics were reviewed and/or handout was given):  -Nutrition: Stressed importance of moderation in sodium/caffeine intake, saturated fat and cholesterol, caloric balance, sufficient intake of fresh fruits, vegetables, and fiber.  -Stressed the importance of regular exercise.   -Substance Abuse: Discussed cessation/primary prevention of tobacco, alcohol, or other drug use; driving or other dangerous activities under the influence; availability of treatment for abuse.   -Injury prevention: Discussed safety belts, safety helmets, smoke detector, smoking near bedding or upholstery.   -Sexuality: Discussed sexually transmitted diseases, partner selection, use of condoms, avoidance of unintended pregnancy and contraceptive alternatives.   -Dental health: Discussed importance of regular tooth brushing, flossing, and dental visits.  -Health maintenance and immunizations reviewed. Please refer to Health maintenance section.  Return to care in 1 year for next preventative visit.     Subjective:  HPI:  He has no acute complaints today.   He does notice that sometimes especially at night he has difficulty breathing out of one of his nostrils. This comes and goes.   Lifestyle Diet: Balanced. Plenty of fruits and vegetables. Working on intermittent fasting.  Exercise: Very active with work.      09/13/2022    8:07 AM   Depression screen PHQ 2/9  Decreased Interest 0  Down, Depressed, Hopeless 0  PHQ - 2 Score 0   ROS: Per HPI, otherwise a complete review of systems was negative.   PMH:  The following were reviewed and entered/updated in epic: History reviewed. No pertinent past medical history. Patient Active Problem List   Diagnosis Date Noted   Allergic rhinitis 09/04/2019   Elevated LFTs 09/01/2018   Dyslipidemia 09/01/2018   History reviewed. No pertinent surgical history.  Family History  Problem Relation Age of Onset   Liver disease Mother    Mesothelioma Maternal Grandfather    Heart disease Neg Hx     Medications- reviewed and updated No current outpatient medications on file.   No current facility-administered medications for this visit.    Allergies-reviewed and updated No Known Allergies  Social History   Socioeconomic History   Marital status: Married    Spouse name: Not on file   Number of children: Not on file   Years of education: Not on file   Highest education level: Not on file  Occupational History   Not on file  Tobacco Use   Smoking status: Former   Smokeless tobacco: Never   Tobacco comments:    4 years about a pack per day  Substance and Sexual Activity   Alcohol use: Yes   Drug use: Never   Sexual activity: Yes  Other Topics Concern   Not on file  Social History Narrative   Not on file   Social Determinants of Health   Financial Resource Strain: Not on file  Food Insecurity: Not on file  Transportation Needs: Not on file  Physical Activity: Not on file  Stress: Not on file  Social Connections:  Not on file        Objective:  Physical Exam: BP 117/72   Pulse (!) 51   Temp 97.8 F (36.6 C) (Temporal)   Ht 6' (1.829 m)   Wt 170 lb (77.1 kg)   SpO2 99%   BMI 23.06 kg/m   Body mass index is 23.06 kg/m. Wt Readings from Last 3 Encounters:  09/13/22 170 lb (77.1 kg)  09/11/21 167 lb (75.8 kg)  12/26/20 172 lb 12.8 oz (78.4 kg)    Gen: NAD, resting comfortably HEENT: TMs normal bilaterally. OP clear. No thyromegaly noted.  CV: RRR with no murmurs appreciated Pulm: NWOB, CTAB with no crackles, wheezes, or rhonchi GI: Normal bowel sounds present. Soft, Nontender, Nondistended. MSK: no edema, cyanosis, or clubbing noted Skin: warm, dry Neuro: CN2-12 grossly intact. Strength 5/5 in upper and lower extremities. Reflexes symmetric and intact bilaterally.  Psych: Normal affect and thought content     Iban Utz M. Jerline Pain, MD 09/13/2022 8:40 AM

## 2022-09-13 NOTE — Assessment & Plan Note (Signed)
Still having quite a bit of issues especially at night.  May have mild deviated septum.  Will place referral to ENT for further management.

## 2022-09-13 NOTE — Assessment & Plan Note (Signed)
He is doing great with lifestyle modifications.  Check labs today.

## 2022-09-17 NOTE — Progress Notes (Signed)
Please inform patient of the following:  His "bad" cholesterol is a little elevated compared to last year and everything else is stable.  He should continue to work on diet and exercise and we can recheck everything in a year.

## 2022-11-19 DIAGNOSIS — J343 Hypertrophy of nasal turbinates: Secondary | ICD-10-CM | POA: Diagnosis not present

## 2022-11-19 DIAGNOSIS — R0981 Nasal congestion: Secondary | ICD-10-CM | POA: Diagnosis not present

## 2022-11-19 DIAGNOSIS — J3489 Other specified disorders of nose and nasal sinuses: Secondary | ICD-10-CM | POA: Diagnosis not present

## 2023-12-03 ENCOUNTER — Encounter: Payer: 59 | Admitting: Family Medicine

## 2023-12-09 ENCOUNTER — Encounter: Payer: Self-pay | Admitting: Family Medicine

## 2023-12-09 ENCOUNTER — Ambulatory Visit (INDEPENDENT_AMBULATORY_CARE_PROVIDER_SITE_OTHER): Payer: 59 | Admitting: Family Medicine

## 2023-12-09 VITALS — BP 122/74 | HR 55 | Temp 97.1°F | Ht 72.0 in | Wt 171.2 lb

## 2023-12-09 DIAGNOSIS — Z131 Encounter for screening for diabetes mellitus: Secondary | ICD-10-CM | POA: Diagnosis not present

## 2023-12-09 DIAGNOSIS — Z0001 Encounter for general adult medical examination with abnormal findings: Secondary | ICD-10-CM

## 2023-12-09 DIAGNOSIS — E785 Hyperlipidemia, unspecified: Secondary | ICD-10-CM | POA: Diagnosis not present

## 2023-12-09 DIAGNOSIS — R7989 Other specified abnormal findings of blood chemistry: Secondary | ICD-10-CM

## 2023-12-09 DIAGNOSIS — J309 Allergic rhinitis, unspecified: Secondary | ICD-10-CM

## 2023-12-09 LAB — COMPREHENSIVE METABOLIC PANEL WITH GFR
ALT: 31 U/L (ref 0–53)
AST: 22 U/L (ref 0–37)
Albumin: 4.5 g/dL (ref 3.5–5.2)
Alkaline Phosphatase: 57 U/L (ref 39–117)
BUN: 12 mg/dL (ref 6–23)
CO2: 27 meq/L (ref 19–32)
Calcium: 9.5 mg/dL (ref 8.4–10.5)
Chloride: 104 meq/L (ref 96–112)
Creatinine, Ser: 0.91 mg/dL (ref 0.40–1.50)
GFR: 103.08 mL/min (ref >60.00)
Glucose, Bld: 87 mg/dL (ref 70–99)
Potassium: 4.1 meq/L (ref 3.5–5.1)
Sodium: 138 meq/L (ref 135–145)
Total Bilirubin: 0.6 mg/dL (ref 0.2–1.2)
Total Protein: 7.4 g/dL (ref 6.0–8.3)

## 2023-12-09 LAB — LIPID PANEL
Cholesterol: 182 mg/dL (ref 0–200)
HDL: 52.4 mg/dL (ref >39.00)
LDL Cholesterol: 107 mg/dL — ABNORMAL HIGH (ref 0–99)
NonHDL: 129.16
Total CHOL/HDL Ratio: 3
Triglycerides: 111 mg/dL (ref 0.0–149.0)
VLDL: 22.2 mg/dL (ref 0.0–40.0)

## 2023-12-09 LAB — CBC
HCT: 47.2 % (ref 39.0–52.0)
Hemoglobin: 16 g/dL (ref 13.0–17.0)
MCHC: 33.8 g/dL (ref 30.0–36.0)
MCV: 92.8 fl (ref 78.0–100.0)
Platelets: 319 10*3/uL (ref 150.0–400.0)
RBC: 5.09 Mil/uL (ref 4.22–5.81)
RDW: 12.8 % (ref 11.5–15.5)
WBC: 6.2 10*3/uL (ref 4.0–10.5)

## 2023-12-09 LAB — TSH: TSH: 3.36 u[IU]/mL (ref 0.35–5.50)

## 2023-12-09 LAB — HEMOGLOBIN A1C: Hgb A1c MFr Bld: 5.3 % (ref 4.6–6.5)

## 2023-12-09 NOTE — Assessment & Plan Note (Signed)
 Check labs.  Discussed lifestyle modifications.

## 2023-12-09 NOTE — Patient Instructions (Addendum)
 It was very nice to see you today!  We will check labs today.  Please continue to work on diet and exercise.   Will see back in year for your next physical.  Come back sooner if needed.  Return in about 1 year (around 12/08/2024) for Annual Physical.   Take care, Dr Daneil Dunker  PLEASE NOTE:  If you had any lab tests, please let us  know if you have not heard back within a few days. You may see your results on mychart before we have a chance to review them but we will give you a call once they are reviewed by us .   If we ordered any referrals today, please let us  know if you have not heard from their office within the next week.   If you had any urgent prescriptions sent in today, please check with the pharmacy within an hour of our visit to make sure the prescription was transmitted appropriately.   Please try these tips to maintain a healthy lifestyle:  Eat at least 3 REAL meals and 1-2 snacks per day.  Aim for no more than 5 hours between eating.  If you eat breakfast, please do so within one hour of getting up.   Each meal should contain half fruits/vegetables, one quarter protein, and one quarter carbs (no bigger than a computer mouse)  Cut down on sweet beverages. This includes juice, soda, and sweet tea.   Drink at least 1 glass of water with each meal and aim for at least 8 glasses per day  Exercise at least 150 minutes every week.    Preventive Care 52-70 Years Old, Male Preventive care refers to lifestyle choices and visits with your health care provider that can promote health and wellness. Preventive care visits are also called wellness exams. What can I expect for my preventive care visit? Counseling During your preventive care visit, your health care provider may ask about your: Medical history, including: Past medical problems. Family medical history. Current health, including: Emotional well-being. Home life and relationship well-being. Sexual activity. Lifestyle,  including: Alcohol, nicotine or tobacco, and drug use. Access to firearms. Diet, exercise, and sleep habits. Safety issues such as seatbelt and bike helmet use. Sunscreen use. Work and work Astronomer. Physical exam Your health care provider will check your: Height and weight. These may be used to calculate your BMI (body mass index). BMI is a measurement that tells if you are at a healthy weight. Waist circumference. This measures the distance around your waistline. This measurement also tells if you are at a healthy weight and may help predict your risk of certain diseases, such as type 2 diabetes and high blood pressure. Heart rate and blood pressure. Body temperature. Skin for abnormal spots. What immunizations do I need?  Vaccines are usually given at various ages, according to a schedule. Your health care provider will recommend vaccines for you based on your age, medical history, and lifestyle or other factors, such as travel or where you work. What tests do I need? Screening Your health care provider may recommend screening tests for certain conditions. This may include: Lipid and cholesterol levels. Diabetes screening. This is done by checking your blood sugar (glucose) after you have not eaten for a while (fasting). Hepatitis B test. Hepatitis C test. HIV (human immunodeficiency virus) test. STI (sexually transmitted infection) testing, if you are at risk. Lung cancer screening. Prostate cancer screening. Colorectal cancer screening. Talk with your health care provider about your test results, treatment  options, and if necessary, the need for more tests. Follow these instructions at home: Eating and drinking  Eat a diet that includes fresh fruits and vegetables, whole grains, lean protein, and low-fat dairy products. Take vitamin and mineral supplements as recommended by your health care provider. Do not drink alcohol if your health care provider tells you not to  drink. If you drink alcohol: Limit how much you have to 0-2 drinks a day. Know how much alcohol is in your drink. In the [place] one drink equals one 12 oz bottle of beer (355 mL), one 5 oz glass of wine (148 mL), or one 1 oz glass of hard liquor (44 mL). Lifestyle Brush your teeth every morning and night with fluoride toothpaste. Floss one time each day. Exercise for at least 30 minutes 5 or more days each week. Do not use any products that contain nicotine or tobacco. These products include cigarettes, chewing tobacco, and vaping devices, such as e-cigarettes. If you need help quitting, ask your health care provider. Do not use drugs. If you are sexually active, practice safe sex. Use a condom or other form of protection to prevent STIs. Take aspirin only as told by your health care provider. Make sure that you understand how much to take and what form to take. Work with your health care provider to find out whether it is safe and beneficial for you to take aspirin daily. Find healthy ways to manage stress, such as: Meditation, yoga, or listening to music. Journaling. Talking to a trusted person. Spending time with friends and family. Minimize exposure to UV radiation to reduce your risk of skin cancer. Safety Always wear your seat belt while driving or riding in a vehicle. Do not drive: If you have been drinking alcohol. Do not ride with someone who has been drinking. When you are tired or distracted. While texting. If you have been using any mind-altering substances or drugs. Wear a helmet and other protective equipment during sports activities. If you have firearms in your house, make sure you follow all gun safety procedures. What's next? Go to your health care provider once a year for an annual wellness visit. Ask your health care provider how often you should have your eyes and teeth checked. Stay up to date on all vaccines. This information is not intended to replace advice  given to you by your health care provider. Make sure you discuss any questions you have with your health care provider. Document Revised: 01/25/2021 Document Reviewed: 01/25/2021 Elsevier Patient Education  2024 [employer].

## 2023-12-09 NOTE — Progress Notes (Signed)
 Chief Complaint:  Roy Cervantes is a 43 y.o. male who presents today for his annual comprehensive physical exam.    Assessment/Plan:  Chronic Problems Addressed Today: Elevated LFTs Resolved with dietary changes.  Check labs today.  Dyslipidemia Check labs.  Discussed lifestyle modifications.  Preventative Healthcare: Check labs. UpToDate on vaccines and screenings.   Patient Counseling(The following topics were reviewed and/or handout was given):  -Nutrition: Stressed importance of moderation in sodium/caffeine intake, saturated fat and cholesterol, caloric balance, sufficient intake of fresh fruits, vegetables, and fiber.  -Stressed the importance of regular exercise.   -Substance Abuse: Discussed cessation/primary prevention of tobacco, alcohol, or other drug use; driving or other dangerous activities under the influence; availability of treatment for abuse.   -Injury prevention: Discussed safety belts, safety helmets, smoke detector, smoking near bedding or upholstery.   -Sexuality: Discussed sexually transmitted diseases, partner selection, use of condoms, avoidance of unintended pregnancy and contraceptive alternatives.   -Dental health: Discussed importance of regular tooth brushing, flossing, and dental visits.  -Health maintenance and immunizations reviewed. Please refer to Health maintenance section.  Return to care in 1 year for next preventative visit.     Subjective:  HPI:  He has no acute complaints today. See assessment / plan for status of chronic conditions.   Lifestyle Diet: Balanced. Plenty of fruits and vegetables  Exercise: Very active with work at [employer]. Does a lot      12/09/2023    7:24 AM  Depression screen PHQ 2/9  Decreased Interest 0  Down, Depressed, Hopeless 0  PHQ - 2 Score 0   ROS: Per HPI, otherwise a complete review of systems was negative.   PMH:  The following were reviewed and entered/updated in epic: History reviewed. No  pertinent past medical history. Patient Active Problem List   Diagnosis Date Noted   Allergic rhinitis 09/04/2019   Elevated LFTs 09/01/2018   Dyslipidemia 09/01/2018   History reviewed. No pertinent surgical history.  Family History  Problem Relation Age of Onset   Liver disease Mother    Mesothelioma Maternal Grandfather    Heart disease Neg Hx     Medications- reviewed and updated No current outpatient medications on file.   No current facility-administered medications for this visit.    Allergies-reviewed and updated No Known Allergies  Social History   Socioeconomic History   Marital status: Married    Spouse name: Not on file   Number of children: Not on file   Years of education: Not on file   Highest education level: Not on file  Occupational History   Not on file  Tobacco Use   Smoking status: Former   Smokeless tobacco: Never   Tobacco comments:    4 years about a pack per day  Substance and Sexual Activity   Alcohol use: Yes   Drug use: Never   Sexual activity: Yes  Other Topics Concern   Not on file  Social History Narrative   Not on file   Social Drivers of Health   Financial Resource Strain: Not on file  Food Insecurity: Not on file  Transportation Needs: Not on file  Physical Activity: Not on file  Stress: Not on file  Social Connections: Not on file        Objective:  Physical Exam: BP 122/74   Pulse (!) 55   Temp (!) 97.1 F (36.2 C) (Temporal)   Ht 6' (1.829 m)   Wt 171 lb 3.2 oz (77.7 kg)  SpO2 99%   BMI 23.22 kg/m   Body mass index is 23.22 kg/m. Wt Readings from Last 3 Encounters:  12/09/23 171 lb 3.2 oz (77.7 kg)  09/13/22 170 lb (77.1 kg)  09/11/21 167 lb (75.8 kg)  Gen: NAD, resting comfortably HEENT: TMs normal bilaterally. OP clear. No thyromegaly noted.  CV: RRR with no murmurs appreciated Pulm: NWOB, CTAB with no crackles, wheezes, or rhonchi GI: Normal bowel sounds present. Soft, Nontender,  Nondistended. MSK: no edema, cyanosis, or clubbing noted Skin: warm, dry Neuro: CN2-12 grossly intact. Strength 5/5 in upper and lower extremities. Reflexes symmetric and intact bilaterally.  Psych: Normal affect and thought content     Samanatha Brammer M. Daneil Dunker, MD 12/09/2023 8:00 AM

## 2023-12-09 NOTE — Assessment & Plan Note (Signed)
 Resolved with dietary changes.  Check labs today.

## 2023-12-10 ENCOUNTER — Encounter: Payer: Self-pay | Admitting: Family Medicine

## 2023-12-10 NOTE — Progress Notes (Signed)
 This cholesterol is mildly elevated but better than last year.  The rest of his labs are at goal.  Do not need to start any meds for this.  He should continue to work on diet and exercise and we can recheck everything in a year or so.

## 2024-12-09 ENCOUNTER — Encounter: Admitting: Family Medicine
